# Patient Record
Sex: Female | Born: 1975
Health system: Southern US, Community
[De-identification: ages and names within clinical notes are randomized; demographics above are authoritative.]

## PROBLEM LIST (undated history)

## (undated) DIAGNOSIS — T8859XA Other complications of anesthesia, initial encounter: Secondary | ICD-10-CM

## (undated) DIAGNOSIS — M797 Fibromyalgia: Secondary | ICD-10-CM

## (undated) DIAGNOSIS — J329 Chronic sinusitis, unspecified: Secondary | ICD-10-CM

## (undated) DIAGNOSIS — I10 Essential (primary) hypertension: Secondary | ICD-10-CM

## (undated) DIAGNOSIS — S43439A Superior glenoid labrum lesion of unspecified shoulder, initial encounter: Secondary | ICD-10-CM

## (undated) DIAGNOSIS — E785 Hyperlipidemia, unspecified: Secondary | ICD-10-CM

## (undated) DIAGNOSIS — T4145XA Adverse effect of unspecified anesthetic, initial encounter: Secondary | ICD-10-CM

## (undated) DIAGNOSIS — Z87442 Personal history of urinary calculi: Secondary | ICD-10-CM

## (undated) HISTORY — PX: LAPAROSCOPY: SHX197

## (undated) HISTORY — PX: WISDOM TOOTH EXTRACTION: SHX21

## (undated) HISTORY — DX: Fibromyalgia: M79.7

## (undated) HISTORY — PX: BREAST SURGERY: SHX581

## (undated) HISTORY — DX: Hyperlipidemia, unspecified: E78.5

## (undated) HISTORY — PX: TUMOR REMOVAL: SHX12

## (undated) HISTORY — DX: Essential (primary) hypertension: I10

---

## 1997-08-27 ENCOUNTER — Inpatient Hospital Stay (HOSPITAL_COMMUNITY): Admission: AD | Admit: 1997-08-27 | Discharge: 1997-08-27 | Payer: Self-pay | Admitting: Obstetrics & Gynecology

## 1997-09-01 ENCOUNTER — Inpatient Hospital Stay (HOSPITAL_COMMUNITY): Admission: AD | Admit: 1997-09-01 | Discharge: 1997-09-01 | Payer: Self-pay | Admitting: Obstetrics and Gynecology

## 1997-09-20 ENCOUNTER — Inpatient Hospital Stay (HOSPITAL_COMMUNITY): Admission: AD | Admit: 1997-09-20 | Discharge: 1997-09-22 | Payer: Self-pay | Admitting: Obstetrics and Gynecology

## 1997-10-26 ENCOUNTER — Other Ambulatory Visit: Admission: RE | Admit: 1997-10-26 | Discharge: 1997-10-26 | Payer: Self-pay | Admitting: Obstetrics & Gynecology

## 1999-01-10 ENCOUNTER — Other Ambulatory Visit: Admission: RE | Admit: 1999-01-10 | Discharge: 1999-01-10 | Payer: Self-pay | Admitting: Obstetrics & Gynecology

## 1999-11-13 ENCOUNTER — Other Ambulatory Visit: Admission: RE | Admit: 1999-11-13 | Discharge: 1999-11-13 | Payer: Self-pay | Admitting: Obstetrics & Gynecology

## 1999-12-24 ENCOUNTER — Encounter (INDEPENDENT_AMBULATORY_CARE_PROVIDER_SITE_OTHER): Payer: Self-pay

## 1999-12-24 ENCOUNTER — Other Ambulatory Visit: Admission: RE | Admit: 1999-12-24 | Discharge: 1999-12-24 | Payer: Self-pay | Admitting: Plastic Surgery

## 2002-01-06 ENCOUNTER — Other Ambulatory Visit: Admission: RE | Admit: 2002-01-06 | Discharge: 2002-01-06 | Payer: Self-pay | Admitting: Obstetrics & Gynecology

## 2002-12-29 ENCOUNTER — Other Ambulatory Visit: Admission: RE | Admit: 2002-12-29 | Discharge: 2002-12-29 | Payer: Self-pay | Admitting: Obstetrics & Gynecology

## 2004-01-02 ENCOUNTER — Other Ambulatory Visit: Admission: RE | Admit: 2004-01-02 | Discharge: 2004-01-02 | Payer: Self-pay | Admitting: Obstetrics & Gynecology

## 2004-02-23 ENCOUNTER — Encounter: Admission: RE | Admit: 2004-02-23 | Discharge: 2004-05-23 | Payer: Self-pay | Admitting: Obstetrics and Gynecology

## 2004-03-19 ENCOUNTER — Encounter: Admission: RE | Admit: 2004-03-19 | Discharge: 2004-03-19 | Payer: Self-pay | Admitting: Otolaryngology

## 2005-01-29 ENCOUNTER — Other Ambulatory Visit: Admission: RE | Admit: 2005-01-29 | Discharge: 2005-01-29 | Payer: Self-pay | Admitting: Obstetrics & Gynecology

## 2005-11-22 ENCOUNTER — Emergency Department (HOSPITAL_COMMUNITY): Admission: EM | Admit: 2005-11-22 | Discharge: 2005-11-22 | Payer: Self-pay | Admitting: Emergency Medicine

## 2006-03-31 ENCOUNTER — Ambulatory Visit (HOSPITAL_COMMUNITY): Admission: RE | Admit: 2006-03-31 | Discharge: 2006-03-31 | Payer: Self-pay | Admitting: Obstetrics & Gynecology

## 2007-06-27 ENCOUNTER — Emergency Department (HOSPITAL_COMMUNITY): Admission: EM | Admit: 2007-06-27 | Discharge: 2007-06-28 | Payer: Self-pay | Admitting: Emergency Medicine

## 2007-08-24 ENCOUNTER — Ambulatory Visit: Payer: Self-pay | Admitting: Cardiology

## 2007-09-02 ENCOUNTER — Ambulatory Visit: Payer: Self-pay | Admitting: Cardiology

## 2007-09-02 ENCOUNTER — Encounter: Payer: Self-pay | Admitting: Cardiology

## 2007-09-02 ENCOUNTER — Ambulatory Visit: Payer: Self-pay

## 2007-09-02 LAB — CONVERTED CEMR LAB
CRP, High Sensitivity: 8 — ABNORMAL HIGH (ref 0.00–5.00)
HDL: 39.5 mg/dL (ref >39.0)
Total CHOL/HDL Ratio: 6.6
VLDL: 55 mg/dL — ABNORMAL HIGH (ref 0–40)

## 2007-10-06 ENCOUNTER — Ambulatory Visit: Payer: Self-pay | Admitting: Cardiology

## 2007-12-29 ENCOUNTER — Ambulatory Visit: Payer: Self-pay | Admitting: Cardiology

## 2007-12-29 LAB — CONVERTED CEMR LAB
ALT: 16 units/L (ref 0–35)
Alkaline Phosphatase: 52 units/L (ref 39–117)
Bilirubin, Direct: 0.1 mg/dL (ref 0.0–0.3)
Cholesterol: 116 mg/dL (ref 0–200)
LDL Cholesterol: 52 mg/dL (ref 0–99)
Total Bilirubin: 0.4 mg/dL (ref 0.3–1.2)
Total Protein: 6.1 g/dL (ref 6.0–8.3)

## 2008-01-06 ENCOUNTER — Ambulatory Visit: Payer: Self-pay | Admitting: Cardiology

## 2008-02-29 ENCOUNTER — Encounter: Admission: RE | Admit: 2008-02-29 | Discharge: 2008-02-29 | Payer: Self-pay | Admitting: Family Medicine

## 2010-03-04 ENCOUNTER — Encounter: Payer: Self-pay | Admitting: Obstetrics & Gynecology

## 2010-06-26 NOTE — Assessment & Plan Note (Signed)
[facility]ALTHC[facility]                            CARDIOLOGY OFFICE NOTE   JAMILETTE, SUCHOCKI                MRN:          [id number]  DATE:08/24/2007                            DOB:          [birth year]    I was asked by Dr. Varney Baas to consult on Christine Davenport with  chest discomfort.   HISTORY OF PRESENT ILLNESS:  She is 35 years of age, married, and has 1  child.  She developed Lyme's disease a couple of months ago  characterized by what sounds like a herald patch over the right upper  extremity.  She then developed some chest discomfort that was described  as sharp substernal pain.  It then went into her left shoulder where she  said she felt numb and tingly all the way to her fingers.   She does not have this discomfort frequently.  It is not related to  taking a deep breath or cough.  She denied any recent fever, chills, or  sweats.  She has had no hemoptysis.   When she exerts herself, she actually feels better.  She has not had any  dyspnea on exertion or chest tightness or any symptoms that will be  consistent with potential for ischemia.   She did receive a course of antibiotics for her Lyme's disease.   PAST MEDICAL HISTORY:  She is intolerant to AMOXICILLIN, CODEINE,  CECLOR, SULFUR, CLARITIN, and TOPAMAX.   She does not smoke, does not drink.  She is in to karate 3 times a week.   PAST SURGICAL HISTORY:  Laparoscopes in 2000-2008, bilateral breast  reduction in 2001, ankle surgery for tumors in 1997, and wisdom tooth  extraction in 1995.   CURRENT MEDICATIONS:  1. B12.  2. One a day B2.  3. One a day B3 50,000 international units a week.  4. D3 a 1000 units daily.  5. Magnesium 500 mg a day.  6. Fish oil 1000 mg b.i.d.  7. Loestrin 24 daily.  8. Imipramine 10 mg a day.  9. Hydrochlorothiazide 50 mg a day.   FAMILY HISTORY:  Her father had an MI at age 63.  He smoked remotely  prior to that.  He had no other major  risk factors that I could tell.   SOCIAL HISTORY:  She is an Print production planner.  She has a sedentary job.  She has 1 child.   REVIEW OF SYSTEMS:  She has a history of allergies and hay fever.  She  has chronic fatigue.  She has had some reflux and menstrual dysfunction.  All of her other review of systems were queried and are negative.   PHYSICAL EXAMINATION:  VITAL SIGNS:  Her blood pressure is 141/97, her  heart rate is 86 and regular, her height is 5 feet 1 inch, and she  weighs 166 pounds.  HEENT:  Normocephalic, atraumatic.  PERRL.  Extraocular movements  intact.  Sclerae are clear.  Facial symmetry is normal.  NECK:  Carotids upstrokes equal bilaterally without bruits.  NECK:  Supple.  Thyroid is not enlarged.  There is no obvious  lymphadenopathy.  LUNGS:  Clear to auscultation.  No rub.  HEART:  Poorly appreciated PMI.  Normal S1 and S2 without rub or gallop.  ABDOMEN:  Protuberant, good bowel sounds.  No obvious midline bruit.  No  hepatomegaly.  EXTREMITIES:  No cyanosis, clubbing, or edema.  Pulses are intact.  SKIN:  No rashes or areas of concern.  NEURO:  Intact.   EKG is normal except for a slight rightward axis which is minimal.   She actually visited the emergency room for this chief complaint of  chest pain on Jun 27, 2007.  At that time, her cardiac markers were  negative.  Her EKG was said to be normal, but I do not have a copy of  it, and her chemistry profile and hemoglobin were normal.   ASSESSMENT AND PLAN:  Ms. Steinruck discomfort is atypical, but could  be related to some pericarditis particularly the sharp quality that she  has had.  Tick-borne diseases can cause a rare associated problem with  cardiac involvement.  I see no evidence of this today by EKG.  However,  a 2-D echocardiogram will rule out any significant effusion or other  abnormality.   She is said to have high lipids.  With her family history, she may well  qualify for a statin therapy  based on the JUPITER study.  We tried to  obtain these values from Dr. Donnetta Hail office, but we could not get them.  We will plan on doing fasting lipids and a high-sensitivity CRP.   We will discuss the need for lipids and also followup echo with her by  phone.     Thomas C. Daleen Squibb, MD, [facility]  Electronically Signed    TCW/MedQ  DD: 08/24/2007  DT: 08/25/2007  Job #: [id number]   cc:   Freddy Finner, M.D.

## 2010-06-26 NOTE — Assessment & Plan Note (Signed)
[facility]CARE                            CARDIOLOGY OFFICE NOTE   KESHARA, KIGER                MRN:          [id number]  DATE:01/06/2008                            DOB:          1975-10-28    Ms. Dayley comes in today for followup of her mixed hyperlipidemia.  She has been taking Crestor 10 mg a day.  Her cholesterol has dropped  profoundly from 260 to 116, triglycerides from 274 to 179, LDL from 176  to 52, total cholesterol to HDL ratio was 4.1.  Unfortunately, her HDL  did drop from 39.5 to 28.4.   Her blood pressure has been under good control.  She has been drinking a  lot of caffeinated beverages and is very stressed out about the  holidays.   PHYSICAL EXAMINATION:  VITAL SIGNS:  Her blood pressure today is 130/98,  her pulse is 107 and regular.  Weight is stable 167.  HEENT:  Unchanged.  NECK:  Supple.  Carotid upstrokes were equal bilaterally.  LUNGS:  Clear.  HEART:  A regular rate and rhythm.  ABDOMEN:  Soft.  EXTREMITIES:  There were no edema.  Pulses are intact.  NEUROLOGIC:  Intact.   I am delighted that Ms. Brophy has had such an incredible response to  Crestor just 10 mg a day.  We will cut her to 5 hoping that her HDL will  increase back into the mid 30s to high 30s and her total cholesterol  will stay 150 or less with an LDL well less than 100.   We will check blood work in 3 months.  I will see her back in 6 months.   I have also encouraged her to drink less caffeinated beverages and  increase free water.  She is on hydrochlorothiazide 50 mg a day.     Thomas C. Daleen Squibb, MD, [facility]  Electronically Signed    TCW/MedQ  DD: 01/06/2008  DT: 01/06/2008  Job #: [id number]

## 2010-06-26 NOTE — Assessment & Plan Note (Signed)
[facility]ALTHC[facility]                            CARDIOLOGY OFFICE NOTE   Christine Davenport, Christine Davenport                MRN:          [id number]  DATE:10/06/2007                            DOB:          01-26-1976    Christine Davenport returns today for further management of her family history  of coronary disease, mixed hyperlipidemia, and obesity.   She has really worked hard for months to try to lose weight.  She  calorie counts and exercises on a regular basis.   Her lipid profile showed total cholesterol of 260, triglycerides of 274,  HDL was 39.5, and LDL was 409.8.  Her C-reactive protein was 8, which is  high.   She takes fish oil a gram b.i.d.   Blood pressure today is 121/77, pulse 79 and regular, and weight is 168  and stable.   I had a long talk with Christine Davenport today.  Unless she were to become  a strict vegetarian, lose 35-40 pounds, her numbers are not going to  significantly change.  I would strongly recommend a statin such as  Crestor per the JUPITER study.  I have answered all her questions.  I  have explained the pros and the cons at length.  I have also stated that  the C-reactive protein probably means some significant vasculitis.   PLAN:  1. Begin Crestor 10 mg a day.  2. Check lipids, LFTs, and C-reactive protein in 8 weeks.  3. See me back in 3 months.     Thomas C. Daleen Squibb, MD, [facility]  Electronically Signed    TCW/MedQ  DD: 10/06/2007  DT: 10/07/2007  Job #: [id number]   cc:   Freddy Finner, M.D.

## 2010-11-07 LAB — POCT I-STAT, CHEM 8
BUN: 12
Creatinine, Ser: 0.7
Glucose, Bld: 87
Hemoglobin: 13.6
Potassium: 4.3

## 2010-11-07 LAB — POCT CARDIAC MARKERS
CKMB, poc: <1 — ABNORMAL LOW
Myoglobin, poc: 39.1

## 2010-11-07 LAB — HCG, SERUM, QUALITATIVE: Preg, Serum: NEGATIVE

## 2010-11-27 ENCOUNTER — Emergency Department (HOSPITAL_COMMUNITY)
Admission: EM | Admit: 2010-11-27 | Discharge: 2010-11-28 | Disposition: A | Discharge status: Home / Self Care | Payer: BC Managed Care – PPO | Attending: Emergency Medicine | Admitting: Emergency Medicine

## 2010-11-27 DIAGNOSIS — R109 Unspecified abdominal pain: Secondary | ICD-10-CM | POA: Insufficient documentation

## 2010-11-27 DIAGNOSIS — R1012 Left upper quadrant pain: Secondary | ICD-10-CM | POA: Insufficient documentation

## 2010-11-27 DIAGNOSIS — R1033 Periumbilical pain: Secondary | ICD-10-CM | POA: Insufficient documentation

## 2010-11-27 DIAGNOSIS — I10 Essential (primary) hypertension: Secondary | ICD-10-CM | POA: Insufficient documentation

## 2010-11-27 DIAGNOSIS — K2970 Gastritis, unspecified, without bleeding: Secondary | ICD-10-CM | POA: Insufficient documentation

## 2010-11-27 DIAGNOSIS — R110 Nausea: Secondary | ICD-10-CM | POA: Insufficient documentation

## 2010-11-28 ENCOUNTER — Emergency Department (HOSPITAL_COMMUNITY): Payer: BC Managed Care – PPO

## 2010-11-28 LAB — CBC
Hemoglobin: 14.5 g/dL (ref 12.0–15.0)
MCH: 28.3 pg (ref 26.0–34.0)
Platelets: 244 10*3/uL (ref 150–400)
RBC: 5.12 MIL/uL — ABNORMAL HIGH (ref 3.87–5.11)
WBC: 10.6 10*3/uL — ABNORMAL HIGH (ref 4.0–10.5)

## 2010-11-28 LAB — DIFFERENTIAL
Basophils Absolute: 0.1 10*3/uL (ref 0.0–0.1)
Basophils Relative: 1 % (ref 0–1)
Eosinophils Absolute: 0.1 10*3/uL (ref 0.0–0.7)
Monocytes Relative: 5 % (ref 3–12)
Neutro Abs: 6.3 10*3/uL (ref 1.7–7.7)
Neutrophils Relative %: 60 % (ref 43–77)

## 2010-11-28 LAB — HEPATIC FUNCTION PANEL
Albumin: 4.1 g/dL (ref 3.5–5.2)
Alkaline Phosphatase: 57 U/L (ref 39–117)
Bilirubin, Direct: <0 mg/dL — ABNORMAL LOW (ref 0.0–0.3)
Total Bilirubin: 0.2 mg/dL — ABNORMAL LOW (ref 0.3–1.2)

## 2010-11-28 LAB — URINE MICROSCOPIC-ADD ON

## 2010-11-28 LAB — LIPASE, BLOOD: Lipase: 25 U/L (ref 11–59)

## 2010-11-28 LAB — URINALYSIS, ROUTINE W REFLEX MICROSCOPIC
Bilirubin Urine: NEGATIVE
Leukocytes, UA: NEGATIVE
Nitrite: NEGATIVE
Specific Gravity, Urine: 1.016 (ref 1.005–1.030)
Urobilinogen, UA: 0.2 mg/dL (ref 0.0–1.0)
pH: 6.5 (ref 5.0–8.0)

## 2010-11-28 LAB — POCT PREGNANCY, URINE: Preg Test, Ur: NEGATIVE

## 2010-11-28 MED ORDER — IOHEXOL 300 MG/ML  SOLN
100.0000 mL | Freq: Once | INTRAMUSCULAR | Status: AC | PRN
  Administered 2010-11-28: 100 mL via INTRAVENOUS

## 2011-09-27 ENCOUNTER — Ambulatory Visit (INDEPENDENT_AMBULATORY_CARE_PROVIDER_SITE_OTHER): Payer: BC Managed Care – PPO | Admitting: Emergency Medicine

## 2011-09-27 ENCOUNTER — Telehealth: Payer: Self-pay

## 2011-09-27 VITALS — BP 131/93 | HR 80 | Temp 98.0°F | Resp 16 | Ht 60.5 in | Wt 177.6 lb

## 2011-09-27 DIAGNOSIS — J40 Bronchitis, not specified as acute or chronic: Secondary | ICD-10-CM

## 2011-09-27 DIAGNOSIS — J018 Other acute sinusitis: Secondary | ICD-10-CM

## 2011-09-27 MED ORDER — HYDROCOD POLST-CHLORPHEN POLST 10-8 MG/5ML PO LQCR: 5.0000 mL | Freq: Two times a day (BID) | ORAL | Status: DC | PRN

## 2011-09-27 MED ORDER — LEVOFLOXACIN 500 MG PO TABS: 500.0000 mg | ORAL_TABLET | Freq: Every day | ORAL | Status: AC

## 2011-09-27 MED ORDER — PSEUDOEPHEDRINE-GUAIFENESIN ER 60-600 MG PO TB12: 1.0000 | ORAL_TABLET | Freq: Two times a day (BID) | ORAL | Status: AC

## 2011-09-27 NOTE — Progress Notes (Signed)
   Date:  09/27/2011   Name:  Christine Davenport   DOB:  [birth year]   MRN:  [id number] Gender: female  Age: 36 y.o.  PCP:  Freddy Finner, MD    Chief Complaint: Nausea, Emesis, Fever and Otalgia   History of Present Illness:  Christine Davenport is a 36 y.o. pleasant patient who presents with the following:  Ill since Monday with right ear pain and sinus pressure.  Has clear nasal drainage.  Post nasal drainage unknown color and a non productive cough.  Not responsive to OTC meds.  Fever to 101.5.  Nauseated, some vomiting early.  No stool change.  Myalgias, malaise, fatigue.  Pressure in cheeks and maxillary teeth.  There is no problem list on file for this patient.   No past medical history on file.  No past surgical history on file.  History  Substance Use Topics  . Smoking status: Never Smoker   . Smokeless tobacco: Not on file  . Alcohol Use: Not on file    No family history on file.  Allergies  Allergen Reactions  . Amoxicillin Hives and Other (See Comments)    syncope  . Ceclor (Cefaclor) Other (See Comments) and Hypertension    Increased heart rate  . Claritin (Loratadine) Other (See Comments) and Hypertension    Increased heart rate  . Codeine Nausea And Vomiting  . Topamax (Topiramate) Other (See Comments)    blistering  . Zithromax (Azithromycin) Hives    Medication list has been reviewed and updated.  Current Outpatient Prescriptions on File Prior to Visit  Medication Sig Dispense Refill  . imipramine (TOFRANIL) 10 MG tablet Take 10 mg by mouth at bedtime.        Review of Systems:  As per HPI, otherwise negative.    Physical Examination: Filed Vitals:   09/27/11 1240  BP: 131/93  Pulse: 80  Temp: 98 F (36.7 C)  Resp: 16   Filed Vitals:   09/27/11 1240  Height: 5' 0.5" (1.537 m)  Weight: 177 lb 9.6 oz (80.559 kg)   Body mass index is 34.11 kg/(m^2). Ideal Body Weight: Weight in (lb) to have BMI = 25: 129.9   GEN: WDWN, NAD,  Non-toxic, A & O x 3 HEENT: Atraumatic, Normocephalic. Neck supple. No masses, No LAD.   Ears and Nose: No external deformity.Right otitis media dullness, red, dusky.  Congested nasal mucosa with tenderness maxillary and frontal sinuses CV: RRR, No M/G/R. No JVD. No thrill. No extra heart sounds. PULM: CTA B, no wheezes, crackles, rhonchi. No retractions. No resp. distress. No accessory muscle use. ABD: S, NT, ND, +BS. No rebound. No HSM. EXTR: No c/c/e NEURO Normal gait.  PSYCH: Normally interactive. Conversant. Not depressed or anxious appearing.  Calm demeanor.    Assessment and Plan: Sinusitis and bronchitis augmentin mucinex tussionex Follow up as needed  Carmelina Dane, MD

## 2011-09-27 NOTE — Telephone Encounter (Signed)
error 

## 2012-10-30 ENCOUNTER — Other Ambulatory Visit (HOSPITAL_BASED_OUTPATIENT_CLINIC_OR_DEPARTMENT_OTHER): Payer: Self-pay | Admitting: Family Medicine

## 2012-10-30 ENCOUNTER — Ambulatory Visit (HOSPITAL_BASED_OUTPATIENT_CLINIC_OR_DEPARTMENT_OTHER)
Admission: RE | Admit: 2012-10-30 | Discharge: 2012-10-30 | Disposition: A | Discharge status: Home / Self Care | Payer: BC Managed Care – PPO | Source: Ambulatory Visit | Attending: Family Medicine | Admitting: Family Medicine

## 2012-10-30 DIAGNOSIS — N200 Calculus of kidney: Secondary | ICD-10-CM | POA: Insufficient documentation

## 2012-10-30 DIAGNOSIS — R112 Nausea with vomiting, unspecified: Secondary | ICD-10-CM | POA: Insufficient documentation

## 2012-10-30 DIAGNOSIS — M129 Arthropathy, unspecified: Secondary | ICD-10-CM | POA: Insufficient documentation

## 2012-10-30 DIAGNOSIS — R109 Unspecified abdominal pain: Secondary | ICD-10-CM

## 2012-10-30 MED ORDER — IOHEXOL 300 MG/ML  SOLN
100.0000 mL | Freq: Once | INTRAMUSCULAR | Status: AC | PRN
  Administered 2012-10-30: 100 mL via INTRAVENOUS

## 2012-11-24 ENCOUNTER — Other Ambulatory Visit (HOSPITAL_COMMUNITY): Payer: Self-pay | Admitting: Gastroenterology

## 2012-11-24 DIAGNOSIS — R112 Nausea with vomiting, unspecified: Secondary | ICD-10-CM

## 2012-11-24 DIAGNOSIS — R109 Unspecified abdominal pain: Secondary | ICD-10-CM

## 2012-12-02 ENCOUNTER — Encounter (HOSPITAL_COMMUNITY)
Admission: RE | Admit: 2012-12-02 | Discharge: 2012-12-02 | Disposition: A | Discharge status: Home / Self Care | Payer: BC Managed Care – PPO | Source: Ambulatory Visit | Attending: Gastroenterology | Admitting: Gastroenterology

## 2012-12-02 DIAGNOSIS — R109 Unspecified abdominal pain: Secondary | ICD-10-CM

## 2012-12-02 DIAGNOSIS — R112 Nausea with vomiting, unspecified: Secondary | ICD-10-CM

## 2012-12-02 DIAGNOSIS — R1011 Right upper quadrant pain: Secondary | ICD-10-CM | POA: Insufficient documentation

## 2012-12-02 MED ORDER — TECHNETIUM TC 99M MEBROFENIN IV KIT
5.0000 | PACK | Freq: Once | INTRAVENOUS | Status: AC | PRN
  Administered 2012-12-02: 5 via INTRAVENOUS

## 2012-12-02 MED ORDER — SINCALIDE 5 MCG IJ SOLR: 1.5900 ug/kg | Freq: Once | INTRAMUSCULAR | Status: DC

## 2012-12-02 MED ORDER — STERILE WATER FOR INJECTION IJ SOLN
INTRAMUSCULAR | Status: AC
  Filled 2012-12-02: qty 10

## 2012-12-02 MED ORDER — SINCALIDE 5 MCG IJ SOLR
INTRAMUSCULAR | Status: AC
  Administered 2012-12-02: 1.59 ug via INTRAVENOUS
  Filled 2012-12-02: qty 5

## 2012-12-09 ENCOUNTER — Ambulatory Visit (INDEPENDENT_AMBULATORY_CARE_PROVIDER_SITE_OTHER): Payer: BC Managed Care – PPO | Admitting: General Surgery

## 2012-12-09 ENCOUNTER — Other Ambulatory Visit (INDEPENDENT_AMBULATORY_CARE_PROVIDER_SITE_OTHER): Payer: Self-pay | Admitting: General Surgery

## 2012-12-09 ENCOUNTER — Encounter (INDEPENDENT_AMBULATORY_CARE_PROVIDER_SITE_OTHER): Payer: Self-pay | Admitting: General Surgery

## 2012-12-09 VITALS — BP 136/76 | HR 72 | Temp 98.4°F | Resp 14 | Ht 61.0 in | Wt 174.2 lb

## 2012-12-09 DIAGNOSIS — K811 Chronic cholecystitis: Secondary | ICD-10-CM

## 2012-12-09 DIAGNOSIS — K8050 Calculus of bile duct without cholangitis or cholecystitis without obstruction: Secondary | ICD-10-CM

## 2012-12-09 NOTE — Patient Instructions (Signed)
Symptoms are typical for accelerating gallbladder attacks. We call this biliary colic.  Your CT scan does not show gallstones, but it is not very sensitive for gallstones. In did not show any other major disease process.  you will be scheduled for a gallbladder ultrasound.  We will also go ahead and schedule you for a laparoscopic cholecystectomy with cholangiogram, possible open.      Laparoscopic Cholecystectomy Laparoscopic cholecystectomy is surgery to remove the gallbladder. The gallbladder is located slightly to the right of center in the abdomen, behind the liver. It is a concentrating and storage sac for the bile produced in the liver. Bile aids in the digestion and absorption of fats. Gallbladder disease (cholecystitis) is an inflammation of your gallbladder. This condition is usually caused by a buildup of gallstones (cholelithiasis) in your gallbladder. Gallstones can block the flow of bile, resulting in inflammation and pain. In severe cases, emergency surgery may be required. When emergency surgery is not required, you will have time to prepare for the procedure. Laparoscopic surgery is an alternative to open surgery. Laparoscopic surgery usually has a shorter recovery time. Your common bile duct may also need to be examined and explored. Your caregiver will discuss this with you if he or she feels this should be done. If stones are found in the common bile duct, they may be removed. LET YOUR CAREGIVER KNOW ABOUT:  Allergies to food or medicine.  Medicines taken, including vitamins, herbs, eyedrops, over-the-counter medicines, and creams.  Use of steroids (by mouth or creams).  Previous problems with anesthetics or numbing medicines.  History of bleeding problems or blood clots.  Previous surgery.  Other health problems, including diabetes and kidney problems.  Possibility of pregnancy, if this applies. RISKS AND COMPLICATIONS All surgery is associated with risks. Some  problems that may occur following this procedure include:  Infection.  Damage to the common bile duct, nerves, arteries, veins, or other internal organs such as the stomach or intestines.  Bleeding.  A stone may remain in the common bile duct. BEFORE THE PROCEDURE  Do not take aspirin for 3 days prior to surgery or blood thinners for 1 week prior to surgery.  Do not eat or drink anything after midnight the night before surgery.  Let your caregiver know if you develop a cold or other infectious problem prior to surgery.  You should be present 60 minutes before the procedure or as directed. PROCEDURE  You will be given medicine that makes you sleep (general anesthetic). When you are asleep, your surgeon will make several small cuts (incisions) in your abdomen. One of these incisions is used to insert a small, lighted scope (laparoscope) into the abdomen. The laparoscope helps the surgeon see into your abdomen. Carbon dioxide gas will be pumped into your abdomen. The gas allows more room for the surgeon to perform your surgery. Other operating instruments are inserted through the other incisions. Laparoscopic procedures may not be appropriate when:  There is major scarring from previous surgery.  The gallbladder is extremely inflamed.  There are bleeding disorders or unexpected cirrhosis of the liver.  A pregnancy is near term.  Other conditions make the laparoscopic procedure impossible. If your surgeon feels it is not safe to continue with a laparoscopic procedure, he or she will perform an open abdominal procedure. In this case, the surgeon will make an incision to open the abdomen. This gives the surgeon a larger view and field to work within. This may allow the surgeon to perform  procedures that sometimes cannot be performed with a laparoscope alone. Open surgery has a longer recovery time. AFTER THE PROCEDURE  You will be taken to the recovery area where a nurse will watch and  check your progress.  You may be allowed to go home the same day.  Do not resume physical activities until directed by your caregiver.  You may resume a normal diet and activities as directed. Document Released: 01/28/2005 Document Revised: 04/22/2011 Document Reviewed: 07/13/2010 [facility] Patient Information 2014 [place], [place].

## 2012-12-09 NOTE — Progress Notes (Signed)
Patient ID: Christine Davenport, female   DOB: 08/04/1975, 37 y.o.   MRN: [id number]  Chief Complaint  Patient presents with  . New Evaluation    eval N&V Gallbladder    HPI Christine Davenport is a 37 y.o. female.  She is referred by Dr. Ritta Slot for dilation and management of chronic cholecystitis and biliary dyskinesia. Her primary care physician is Dr. Joycelyn Rua. Her gynecologist is Dr. Konrad Dolores.   She has a 4 to six-week history of upper abdominal pain, sharp, epigastric, radiating to the back, right upper quadrant. Nausea without vomiting. Intermittent diarrhea alternating with solid bowel movements. She clearly has some fatty food intolerance which precipitated some of these attacks. This is big it been more frequent this past week or today.  On 10/30/2012 she had a CT scan of the abdomen which shows some narrowing of the sigmoid colon which was felt to be unchanged from previously. No inflammatory changes. On 12/02/2012 she had a hepatobiliary scan which showed a very low, 7% ejection fraction and she clearly experience pain after CCK injection. Never had an ultrasound.  She is here with her daughter today  Past history is significant for endometriosis. 2 laparoscopies and lysis of adhesions by Dr. Jennette Kettle.   Possible NSAID  gastropathy but never had an upper endoscopy. She was told she had fibromyalgia and is followed at a  pain center as well.   Family  history is negative for colon cancer. Mother had cervical cancer. HPI  Past Medical History  Diagnosis Date  . Hypertension   . Hyperlipidemia   . Fibromyalgia     Past Surgical History  Procedure Laterality Date  . Laparoscopy      2 times  . Breast surgery      reduction  . Tumor removal      nerve tumor on left ankle  . Wisdom tooth extraction      Family History  Problem Relation Age of Onset  . Cancer Mother     cervical  . Heart disease Father   . Cancer Maternal Grandfather     lung  . Cancer Paternal  Grandmother     skin  . Cancer Paternal Grandfather     skin    Social History History  Substance Use Topics  . Smoking status: Never Smoker   . Smokeless tobacco: Never Used  . Alcohol Use: No    Allergies  Allergen Reactions  . Amoxicillin Hives and Other (See Comments)    syncope  . Ceclor [Cefaclor] Other (See Comments) and Hypertension    Increased heart rate  . Claritin [Loratadine] Other (See Comments) and Hypertension    Increased heart rate  . Codeine Nausea And Vomiting  . Sulfur Itching  . Topamax [Topiramate] Other (See Comments)    blistering  . Zithromax [Azithromycin] Hives    Current Outpatient Prescriptions  Medication Sig Dispense Refill  . ALPRAZolam (XANAX) 0.5 MG tablet Take 0.5 mg by mouth at bedtime as needed for sleep.      . baclofen (LIORESAL) 10 MG tablet Take 10 mg by mouth as needed.      . Biotin 5000 MCG TABS Take by mouth.      . CELEBREX 200 MG capsule       . Cholecalciferol (VITAMIN D3) 5000 UNITS CAPS Take by mouth.      Marland Kitchen ibuprofen (ADVIL,MOTRIN) 200 MG tablet Take 200 mg by mouth every 6 (six) hours as needed for pain.      Marland Kitchen  imipramine (TOFRANIL) 10 MG tablet Take 10 mg by mouth at bedtime.      . ranitidine (ZANTAC) 150 MG tablet Take 150 mg by mouth 2 (two) times daily.      . tapentadol (NUCYNTA) 50 MG TABS Take 50 mg by mouth 3 (three) times daily.      . Vitamin D, Ergocalciferol, (DRISDOL) 50000 UNITS CAPS capsule        No current facility-administered medications for this visit.    Review of Systems Review of Systems  Constitutional: Negative for fever, chills and unexpected weight change.  HENT: Negative for congestion, hearing loss, sore throat, trouble swallowing and voice change.   Eyes: Negative for visual disturbance.  Respiratory: Negative for cough and wheezing.   Cardiovascular: Negative for chest pain, palpitations and leg swelling.  Gastrointestinal: Positive for nausea, abdominal pain and diarrhea. Negative  for vomiting, constipation, blood in stool, abdominal distention and anal bleeding.  Genitourinary: Negative for hematuria, vaginal bleeding and difficulty urinating.  Musculoskeletal: Positive for arthralgias, back pain and myalgias.  Skin: Negative for rash and wound.  Neurological: Positive for headaches. Negative for seizures and syncope.  Hematological: Negative for adenopathy. Does not bruise/bleed easily.  Psychiatric/Behavioral: Negative for confusion.    Blood pressure 136/76, pulse 72, temperature 98.4 F (36.9 C), temperature source Temporal, resp. rate 14, height 5\' 1"  (1.549 m), weight 174 lb 3.2 oz (79.017 kg), last menstrual period 11/09/2012.  Physical Exam Physical Exam  Constitutional: She is oriented to person, place, and time. She appears well-developed and well-nourished. No distress.  HENT:  Head: Normocephalic and atraumatic.  Nose: Nose normal.  Mouth/Throat: No oropharyngeal exudate.  Eyes: Conjunctivae and EOM are normal. Pupils are equal, round, and reactive to light. Left eye exhibits no discharge. No scleral icterus.  Neck: Neck supple. No JVD present. No tracheal deviation present. No thyromegaly present.  Cardiovascular: Normal rate, regular rhythm, normal heart sounds and intact distal pulses.   No murmur heard. Pulmonary/Chest: Effort normal and breath sounds normal. No respiratory distress. She has no wheezes. She has no rales. She exhibits no tenderness.  Abdominal: Soft. Bowel sounds are normal. She exhibits no distension and no mass. There is no tenderness. There is no rebound and no guarding.  Transverse scar below  umbilicus. Trocar sites in suprapubic area. Tender in right upper quadrant without guarding or mass.  Musculoskeletal: She exhibits no edema and no tenderness.  Lymphadenopathy:    She has no cervical adenopathy.  Neurological: She is alert and oriented to person, place, and time. She exhibits normal muscle tone. Coordination normal.   Skin: Skin is warm. No rash noted. She is not diaphoretic. No erythema. No pallor.  Psychiatric: She has a normal mood and affect. Her behavior is normal. Judgment and thought content normal.    Data Reviewed CT scan and hepatobiliary scan. Office notes from Dr. Kinnie Scales.  Assessment    Strongly suspect biliary colic, biliary dyskinesia and chronic cholecystitis. Symptom complex, unremarkable CT, and significantly abnormal hepatobiliary scan support this diagnosis. She may or may not have gallstones  History endometriosis  Fibromyalgia, followed by a pain clinic    possible superimposed irritable bowel syndrome, may or may not be contributing to diarrhea.   Question chronic sigmoid colon narrowing. Seen on two previous CT scans.    Plan    Scheduled for laparoscopic cholecystectomy with cholangiogram  Check gallbladder ultrasound preoperatively sure she does not have stones  I discussed the indications, details, techniques, and numerous risk of  the surgery with her. She's aware of the risk of bleeding, infection, conversion to open laparotomy, injury to the colon remained bile duct, wound problems such as hernia, bile leak and other unforeseen problems. She understands all these issues. At this time her questions are answered. She agrees with this plan.  She is aware that she needs a colonoscopy at some point. I agree with Dr. Kinnie Scales that this is not the priority at this point in time        [facility]. Derrell Lolling, M.D., [facility] General and Minimally invasive Surgery Breast and Colorectal Surgery Office:   [phone] Pager:   [phone]  12/09/2012, 5:31 PM

## 2012-12-10 ENCOUNTER — Telehealth (INDEPENDENT_AMBULATORY_CARE_PROVIDER_SITE_OTHER): Payer: Self-pay | Admitting: *Deleted

## 2012-12-10 ENCOUNTER — Other Ambulatory Visit: Payer: BC Managed Care – PPO

## 2012-12-10 NOTE — Telephone Encounter (Signed)
I called and spoke with pt to inform her of her appt for the abdominal US at [facility] on 12/14/12 with an arrival time of 9:15am.  Instructed pt to be NPO after midnight.  Pt agreeable to appt.

## 2012-12-14 ENCOUNTER — Ambulatory Visit
Admission: RE | Admit: 2012-12-14 | Discharge: 2012-12-14 | Disposition: A | Discharge status: Home / Self Care | Payer: BC Managed Care – PPO | Source: Ambulatory Visit | Attending: General Surgery | Admitting: General Surgery

## 2012-12-14 DIAGNOSIS — K8050 Calculus of bile duct without cholangitis or cholecystitis without obstruction: Secondary | ICD-10-CM

## 2012-12-22 ENCOUNTER — Telehealth (INDEPENDENT_AMBULATORY_CARE_PROVIDER_SITE_OTHER): Payer: Self-pay | Admitting: General Surgery

## 2012-12-22 NOTE — Telephone Encounter (Signed)
Pt called in to see if her insurance benefits have changed (deductible ) since 10/29?  Re- ran nothing has changed / will call back when her disability benefits start to pay when she can pay for surgery

## 2013-06-01 ENCOUNTER — Emergency Department (HOSPITAL_COMMUNITY): Payer: BC Managed Care – PPO

## 2013-06-01 ENCOUNTER — Observation Stay (HOSPITAL_COMMUNITY)
Admission: EM | Admit: 2013-06-01 | Discharge: 2013-06-03 | Disposition: A | Discharge status: Home / Self Care | Payer: BC Managed Care – PPO | Attending: Surgery | Admitting: Surgery

## 2013-06-01 ENCOUNTER — Encounter (HOSPITAL_COMMUNITY): Payer: Self-pay | Admitting: Emergency Medicine

## 2013-06-01 DIAGNOSIS — IMO0001 Reserved for inherently not codable concepts without codable children: Secondary | ICD-10-CM | POA: Insufficient documentation

## 2013-06-01 DIAGNOSIS — I10 Essential (primary) hypertension: Secondary | ICD-10-CM | POA: Insufficient documentation

## 2013-06-01 DIAGNOSIS — R109 Unspecified abdominal pain: Secondary | ICD-10-CM | POA: Diagnosis present

## 2013-06-01 DIAGNOSIS — K8000 Calculus of gallbladder with acute cholecystitis without obstruction: Principal | ICD-10-CM | POA: Insufficient documentation

## 2013-06-01 DIAGNOSIS — K8020 Calculus of gallbladder without cholecystitis without obstruction: Secondary | ICD-10-CM

## 2013-06-01 DIAGNOSIS — E785 Hyperlipidemia, unspecified: Secondary | ICD-10-CM | POA: Insufficient documentation

## 2013-06-01 DIAGNOSIS — R1013 Epigastric pain: Secondary | ICD-10-CM

## 2013-06-01 DIAGNOSIS — K8050 Calculus of bile duct without cholangitis or cholecystitis without obstruction: Secondary | ICD-10-CM | POA: Diagnosis present

## 2013-06-01 DIAGNOSIS — E669 Obesity, unspecified: Secondary | ICD-10-CM | POA: Insufficient documentation

## 2013-06-01 LAB — COMPREHENSIVE METABOLIC PANEL
ALT: 14 U/L (ref 0–35)
AST: 19 U/L (ref 0–37)
Albumin: 4.7 g/dL (ref 3.5–5.2)
Alkaline Phosphatase: 66 U/L (ref 39–117)
BUN: 9 mg/dL (ref 6–23)
CO2: 22 meq/L (ref 19–32)
CREATININE: 0.76 mg/dL (ref 0.50–1.10)
Calcium: 9.8 mg/dL (ref 8.4–10.5)
Chloride: 103 mEq/L (ref 96–112)
GFR calc Af Amer: >90 mL/min (ref >90)
GLUCOSE: 99 mg/dL (ref 70–99)
Potassium: 4.4 mEq/L (ref 3.7–5.3)
SODIUM: 142 meq/L (ref 137–147)
Total Bilirubin: 0.4 mg/dL (ref 0.3–1.2)
Total Protein: 8.1 g/dL (ref 6.0–8.3)

## 2013-06-01 LAB — CBC WITH DIFFERENTIAL/PLATELET
Basophils Absolute: 0 10*3/uL (ref 0.0–0.1)
Basophils Relative: 0 % (ref 0–1)
EOS PCT: 0 % (ref 0–5)
Eosinophils Absolute: 0 10*3/uL (ref 0.0–0.7)
HCT: 50 % — ABNORMAL HIGH (ref 36.0–46.0)
Hemoglobin: 17.9 g/dL — ABNORMAL HIGH (ref 12.0–15.0)
LYMPHS PCT: 25 % (ref 12–46)
Lymphs Abs: 4.4 10*3/uL — ABNORMAL HIGH (ref 0.7–4.0)
MCH: 30 pg (ref 26.0–34.0)
MCHC: 35.8 g/dL (ref 30.0–36.0)
MCV: 83.8 fL (ref 78.0–100.0)
MONO ABS: 1.1 10*3/uL — AB (ref 0.1–1.0)
Monocytes Relative: 6 % (ref 3–12)
Neutro Abs: 12.2 10*3/uL — ABNORMAL HIGH (ref 1.7–7.7)
Neutrophils Relative %: 69 % (ref 43–77)
Platelets: 317 10*3/uL (ref 150–400)
RBC: 5.97 MIL/uL — ABNORMAL HIGH (ref 3.87–5.11)
RDW: 12.5 % (ref 11.5–15.5)
WBC: 17.7 10*3/uL — AB (ref 4.0–10.5)

## 2013-06-01 LAB — URINALYSIS, ROUTINE W REFLEX MICROSCOPIC
Bilirubin Urine: NEGATIVE
GLUCOSE, UA: NEGATIVE mg/dL
HGB URINE DIPSTICK: NEGATIVE
KETONES UR: 15 mg/dL — AB
Leukocytes, UA: NEGATIVE
Nitrite: NEGATIVE
Protein, ur: NEGATIVE mg/dL
Specific Gravity, Urine: 1.021 (ref 1.005–1.030)
Urobilinogen, UA: 0.2 mg/dL (ref 0.0–1.0)
pH: 6 (ref 5.0–8.0)

## 2013-06-01 LAB — POC URINE PREG, ED: Preg Test, Ur: NEGATIVE

## 2013-06-01 LAB — LIPASE, BLOOD: Lipase: 28 U/L (ref 11–59)

## 2013-06-01 LAB — I-STAT CG4 LACTIC ACID, ED: LACTIC ACID, VENOUS: 1.38 mmol/L (ref 0.5–2.2)

## 2013-06-01 MED ORDER — METRONIDAZOLE IN NACL 5-0.79 MG/ML-% IV SOLN
500.0000 mg | Freq: Once | INTRAVENOUS | Status: AC
  Administered 2013-06-01: 500 mg via INTRAVENOUS
  Filled 2013-06-01: qty 100

## 2013-06-01 MED ORDER — HYDROMORPHONE HCL PF 1 MG/ML IJ SOLN: 1.0000 mg | INTRAMUSCULAR | Status: DC | PRN

## 2013-06-01 MED ORDER — HYDROMORPHONE HCL PF 1 MG/ML IJ SOLN
1.0000 mg | INTRAMUSCULAR | Status: DC | PRN
  Administered 2013-06-02: 1 mg via INTRAVENOUS
  Filled 2013-06-01: qty 1

## 2013-06-01 MED ORDER — DEXTROSE 5 % IV SOLN
1.0000 g | Freq: Once | INTRAVENOUS | Status: AC
  Administered 2013-06-01: 1 g via INTRAVENOUS
  Filled 2013-06-01: qty 10

## 2013-06-01 MED ORDER — ONDANSETRON HCL 4 MG/2ML IJ SOLN
4.0000 mg | Freq: Once | INTRAMUSCULAR | Status: AC
  Administered 2013-06-01: 4 mg via INTRAVENOUS
  Filled 2013-06-01: qty 2

## 2013-06-01 MED ORDER — HYDROMORPHONE HCL PF 1 MG/ML IJ SOLN
1.0000 mg | Freq: Once | INTRAMUSCULAR | Status: AC
Start: 2013-06-01 — End: 2013-06-01
  Administered 2013-06-01: 1 mg via INTRAVENOUS
  Filled 2013-06-01: qty 1

## 2013-06-01 MED ORDER — PANTOPRAZOLE SODIUM 40 MG IV SOLR
40.0000 mg | Freq: Every day | INTRAVENOUS | Status: DC
Start: 2013-06-01 — End: 2013-06-03
  Administered 2013-06-01 – 2013-06-02 (×2): 40 mg via INTRAVENOUS
  Filled 2013-06-01 (×3): qty 40

## 2013-06-01 MED ORDER — ONDANSETRON HCL 4 MG/2ML IJ SOLN: 4.0000 mg | Freq: Three times a day (TID) | INTRAMUSCULAR | Status: DC | PRN

## 2013-06-01 MED ORDER — ONDANSETRON HCL 4 MG/2ML IJ SOLN
4.0000 mg | Freq: Four times a day (QID) | INTRAMUSCULAR | Status: DC | PRN
  Administered 2013-06-02: 4 mg via INTRAVENOUS
  Filled 2013-06-01: qty 2

## 2013-06-01 MED ORDER — ALPRAZOLAM 0.5 MG PO TABS: 0.5000 mg | ORAL_TABLET | Freq: Every evening | ORAL | Status: DC | PRN

## 2013-06-01 MED ORDER — HYDROMORPHONE HCL PF 1 MG/ML IJ SOLN
1.0000 mg | Freq: Once | INTRAMUSCULAR | Status: AC
  Administered 2013-06-01: 1 mg via INTRAVENOUS
  Filled 2013-06-01: qty 1

## 2013-06-01 MED ORDER — SODIUM CHLORIDE 0.9 % IV BOLUS (SEPSIS)
1000.0000 mL | Freq: Once | INTRAVENOUS | Status: AC
  Administered 2013-06-01: 1000 mL via INTRAVENOUS

## 2013-06-01 MED ORDER — IOHEXOL 300 MG/ML  SOLN
100.0000 mL | Freq: Once | INTRAMUSCULAR | Status: AC | PRN
  Administered 2013-06-01: 100 mL via INTRAVENOUS

## 2013-06-01 MED ORDER — DEXTROSE-NACL 5-0.9 % IV SOLN
INTRAVENOUS | Status: DC
  Administered 2013-06-01 – 2013-06-02 (×2): via INTRAVENOUS

## 2013-06-01 MED ORDER — METRONIDAZOLE IN NACL 5-0.79 MG/ML-% IV SOLN
500.0000 mg | Freq: Three times a day (TID) | INTRAVENOUS | Status: DC
  Administered 2013-06-01 – 2013-06-02 (×2): 500 mg via INTRAVENOUS
  Filled 2013-06-01 (×4): qty 100

## 2013-06-01 MED ORDER — CIPROFLOXACIN IN D5W 400 MG/200ML IV SOLN
400.0000 mg | Freq: Two times a day (BID) | INTRAVENOUS | Status: DC
  Administered 2013-06-01 – 2013-06-03 (×4): 400 mg via INTRAVENOUS
  Filled 2013-06-01 (×5): qty 200

## 2013-06-01 NOTE — ED Provider Notes (Signed)
Medical screening examination/treatment/procedure(s) were performed by non-physician practitioner and as supervising physician I was immediately available for consultation/collaboration.   Celene KrasJon R Jalan Fariss, MD 06/01/13 [phone]

## 2013-06-01 NOTE — ED Notes (Signed)
Pt. Does not want pain meds at this time. She will let me know if that should change.

## 2013-06-01 NOTE — ED Notes (Signed)
CG- result shown to Josh-PA

## 2013-06-01 NOTE — H&P (Signed)
Christine Davenport is an 38 y.o. female.   Chief Complaint: abd pain HPI: 38 y/o F with a h/o biliary dyskenesia.  Pt had been previously worked up and lost her insurance so couldn't follow through with surgery.   Pain started 4d ago.  Increasing pain over last 48hrs.  Epigastric and RUQ pain with some radiation to R back.      Past Medical History  Diagnosis Date  . Hypertension   . Hyperlipidemia   . Fibromyalgia     Past Surgical History  Procedure Laterality Date  . Laparoscopy      2 times  . Breast surgery      reduction  . Tumor removal      nerve tumor on left ankle  . Wisdom tooth extraction      Family History  Problem Relation Age of Onset  . Cancer Mother     cervical  . Heart disease Father   . Cancer Maternal Grandfather     lung  . Cancer Paternal Grandmother     skin  . Cancer Paternal Grandfather     skin   Social History:  reports that she has never smoked. She has never used smokeless tobacco. She reports that she does not drink alcohol or use illicit drugs.  Allergies:  Allergies  Allergen Reactions  . Amoxicillin Hives and Other (See Comments)    syncope  . Ceclor [Cefaclor] Other (See Comments) and Hypertension    Increased heart rate  . Claritin [Loratadine] Other (See Comments) and Hypertension    Increased heart rate  . Codeine Nausea And Vomiting  . Sulfur Itching  . Topamax [Topiramate] Other (See Comments)    blistering  . Zithromax [Azithromycin] Hives     (Not in a hospital admission)  Results for orders placed during the hospital encounter of 06/01/13 (from the past 48 hour(s))  CBC WITH DIFFERENTIAL     Status: Abnormal   Collection Time    06/01/13  2:29 PM      Result Value Ref Range   WBC 17.7 (*) 4.0 - 10.5 K/uL   RBC 5.97 (*) 3.87 - 5.11 MIL/uL   Hemoglobin 17.9 (*) 12.0 - 15.0 g/dL   HCT 50.0 (*) 36.0 - 46.0 %   MCV 83.8  78.0 - 100.0 fL   MCH 30.0  26.0 - 34.0 pg   MCHC 35.8  30.0 - 36.0 g/dL   RDW 12.5   11.5 - 15.5 %   Platelets 317  150 - 400 K/uL   Neutrophils Relative % 69  43 - 77 %   Neutro Abs 12.2 (*) 1.7 - 7.7 K/uL   Lymphocytes Relative 25  12 - 46 %   Lymphs Abs 4.4 (*) 0.7 - 4.0 K/uL   Monocytes Relative 6  3 - 12 %   Monocytes Absolute 1.1 (*) 0.1 - 1.0 K/uL   Eosinophils Relative 0  0 - 5 %   Eosinophils Absolute 0.0  0.0 - 0.7 K/uL   Basophils Relative 0  0 - 1 %   Basophils Absolute 0.0  0.0 - 0.1 K/uL  COMPREHENSIVE METABOLIC PANEL     Status: None   Collection Time    06/01/13  2:29 PM      Result Value Ref Range   Sodium 142  137 - 147 mEq/L   Potassium 4.4  3.7 - 5.3 mEq/L   Chloride 103  96 - 112 mEq/L   CO2 22  19 - 32 mEq/L  Glucose, Bld 99  70 - 99 mg/dL   BUN 9  6 - 23 mg/dL   Creatinine, Ser 0.76  0.50 - 1.10 mg/dL   Calcium 9.8  8.4 - 10.5 mg/dL   Total Protein 8.1  6.0 - 8.3 g/dL   Albumin 4.7  3.5 - 5.2 g/dL   AST 19  0 - 37 U/L   ALT 14  0 - 35 U/L   Alkaline Phosphatase 66  39 - 117 U/L   Total Bilirubin 0.4  0.3 - 1.2 mg/dL   GFR calc non Af Amer >90  >90 mL/min   GFR calc Af Amer >90  >90 mL/min   Comment: (NOTE)     The eGFR has been calculated using the CKD EPI equation.     This calculation has not been validated in all clinical situations.     eGFR's persistently <90 mL/min signify possible Chronic Kidney     Disease.  LIPASE, BLOOD     Status: None   Collection Time    06/01/13  2:29 PM      Result Value Ref Range   Lipase 28  11 - 59 U/L  I-STAT CG4 LACTIC ACID, ED     Status: None   Collection Time    06/01/13  4:51 PM      Result Value Ref Range   Lactic Acid, Venous 1.38  0.5 - 2.2 mmol/L  URINALYSIS, ROUTINE W REFLEX MICROSCOPIC     Status: Abnormal   Collection Time    06/01/13  6:58 PM      Result Value Ref Range   Color, Urine YELLOW  YELLOW   APPearance CLEAR  CLEAR   Specific Gravity, Urine 1.021  1.005 - 1.030   pH 6.0  5.0 - 8.0   Glucose, UA NEGATIVE  NEGATIVE mg/dL   Hgb urine dipstick NEGATIVE  NEGATIVE    Bilirubin Urine NEGATIVE  NEGATIVE   Ketones, ur 15 (*) NEGATIVE mg/dL   Protein, ur NEGATIVE  NEGATIVE mg/dL   Urobilinogen, UA 0.2  0.0 - 1.0 mg/dL   Nitrite NEGATIVE  NEGATIVE   Leukocytes, UA NEGATIVE  NEGATIVE   Comment: MICROSCOPIC NOT DONE ON URINES WITH NEGATIVE PROTEIN, BLOOD, LEUKOCYTES, NITRITE, OR GLUCOSE <1000 mg/dL.  POC URINE PREG, ED     Status: None   Collection Time    06/01/13  7:30 PM      Result Value Ref Range   Preg Test, Ur NEGATIVE  NEGATIVE   Comment:            THE SENSITIVITY OF THIS     METHODOLOGY IS >24 mIU/mL   US Abdomen Complete  06/01/2013   CLINICAL DATA:  Right upper quadrant pain  EXAM: ULTRASOUND ABDOMEN COMPLETE  COMPARISON:  12/14/2012  FINDINGS: Gallbladder:  2.1 cm mobile gallstone. No gallbladder wall thickening or pericholecystic fluid. Positive sonographic Murphy's sign.  Common bile duct:  Diameter: 5 mm  Liver:  Hyperechoic hepatic parenchyma, reflecting hepatic steatosis. No focal hepatic lesion is seen.  IVC:  Not visualized due to overlying bowel gas.  Pancreas:  Poorly visualized due to overlying bowel gas.  Spleen:  Measures 9.9 cm.  Right Kidney:  Length: 11.9 cm.  No mass or hydronephrosis.  Left Kidney:  Length: 11.9 cm.  No mass or hydronephrosis.  Abdominal aorta:  No aneurysm visualized.  Other findings:  None.  IMPRESSION: Cholelithiasis with positive sonographic Murphy's sign, but without associated sonographic findings to suggest acute cholecystitis.  Hepatic steatosis.   Electronically Signed   By: Julian Hy M.D.   On: 06/01/2013 18:35   Ct Abdomen Pelvis W Contrast  06/01/2013   CLINICAL DATA:  Abdominal pain  EXAM: CT ABDOMEN AND PELVIS WITH CONTRAST  TECHNIQUE: Multidetector CT imaging of the abdomen and pelvis was performed using the standard protocol following bolus administration of intravenous contrast.  CONTRAST:  158m OMNIPAQUE IOHEXOL 300 MG/ML  SOLN  COMPARISON:  CT ABD/PELVIS W CM dated 10/30/2012  FINDINGS:  Diffuse hepatic steatosis.  The gallbladder, spleen, pancreas, adrenal, and kidneys are within normal limits.  Normal appendix.  Uterus and bladder are within normal limits. Physiologic cystic changes in the ovaries.  No free-fluid.  No obvious retroperitoneal adenopathy.  L2-3 degenerative disc disease.  IMPRESSION: No acute pathology.   Electronically Signed   By: AMaryclare BeanM.D.   On: 06/01/2013 20:23    Review of Systems  Constitutional: Negative for weight loss.  HENT: Negative for ear discharge, ear pain, hearing loss and tinnitus.   Eyes: Negative for blurred vision, double vision, photophobia and pain.  Respiratory: Negative for cough, sputum production and shortness of breath.   Cardiovascular: Negative for chest pain.  Gastrointestinal: Positive for nausea, vomiting and abdominal pain. Negative for diarrhea.  Genitourinary: Negative for dysuria, urgency, frequency and flank pain.  Musculoskeletal: Negative for back pain, falls, joint pain, myalgias and neck pain.  Neurological: Negative for dizziness, tingling, sensory change, focal weakness, loss of consciousness and headaches.  Endo/Heme/Allergies: Does not bruise/bleed easily.  Psychiatric/Behavioral: Negative for depression, memory loss and substance abuse. The patient is not nervous/anxious.     Blood pressure 132/108, pulse 92, temperature 98.8 F (37.1 C), temperature source Oral, resp. rate 17, height 5' 1" (1.549 m), weight 174 lb (78.926 kg), last menstrual period 05/18/2013, SpO2 97.00%. Physical Exam  Vitals reviewed. Constitutional: She is oriented to person, place, and time. She appears well-developed and well-nourished. She is cooperative. No distress. Cervical collar and nasal cannula in place.  HENT:  Head: Normocephalic and atraumatic. Head is without raccoon's eyes, without Battle's sign, without abrasion, without contusion and without laceration.  Right Ear: Hearing, tympanic membrane, external ear and ear canal  normal. No lacerations. No drainage or tenderness. No foreign bodies. Tympanic membrane is not perforated. No hemotympanum.  Left Ear: Hearing, tympanic membrane, external ear and ear canal normal. No lacerations. No drainage or tenderness. No foreign bodies. Tympanic membrane is not perforated. No hemotympanum.  Nose: Nose normal. No nose lacerations, sinus tenderness, nasal deformity or nasal septal hematoma. No epistaxis.  Mouth/Throat: Uvula is midline, oropharynx is clear and moist and mucous membranes are normal. No lacerations.  Eyes: Conjunctivae, EOM and lids are normal. Pupils are equal, round, and reactive to light. No scleral icterus.  Neck: Trachea normal. No JVD present. No spinous process tenderness and no muscular tenderness present. Carotid bruit is not present. No thyromegaly present.  Cardiovascular: Normal rate, regular rhythm, normal heart sounds, intact distal pulses and normal pulses.   Respiratory: Effort normal and breath sounds normal. No respiratory distress. She exhibits no tenderness, no bony tenderness, no laceration and no crepitus.  GI: Soft. Normal appearance. She exhibits no distension. Bowel sounds are decreased. There is tenderness (RUQ). There is no rigidity, no rebound, no guarding and no CVA tenderness.  Musculoskeletal: Normal range of motion. She exhibits no edema and no tenderness.  Lymphadenopathy:    She has no cervical adenopathy.  Neurological: She is alert and oriented to person,  place, and time. She has normal strength. No cranial nerve deficit or sensory deficit. GCS eye subscore is 4. GCS verbal subscore is 5. GCS motor subscore is 6.  Skin: Skin is warm, dry and intact. She is not diaphoretic.  Psychiatric: She has a normal mood and affect. Her speech is normal and behavior is normal.     Assessment/Plan 38 y/o F with biliary dyskenesia.  US shows no sxs of acute cholecytitis, but with RUQ pain on exam. Will admit and place on IVF.  NPO OR in AM  with Dr. Sandrea Matte 06/01/2013, 9:15 PM

## 2013-06-01 NOTE — ED Notes (Signed)
Transporting patient to new room assignment. 

## 2013-06-01 NOTE — ED Notes (Signed)
Pt. Still does not want medicine for pain.

## 2013-06-01 NOTE — ED Provider Notes (Signed)
CSN: [id number]     Arrival date & time 06/01/13  1408 History   First MD Initiated Contact with Patient 06/01/13 1523     Chief Complaint  Patient presents with  . Abdominal Pain     (Consider location/radiation/quality/duration/timing/severity/associated sxs/prior Treatment) HPI Comments: Patient with history of exploratory laparoscopy due to endometriosis, history of gallstones causing biliary colic and abnormal HIDA scan, unable to have gallbladder removed due to insurance problems -- presents with complaint of "gallbladder attack" for the past 4 days. Patient states that the symptoms became much worse last night. Pain is made worse with movement and palpation. Pain has been associated with vomiting. No diarrhea. No documented fevers. Onset of symptoms acute. Course is gradually worsening. Nothing makes symptoms better  The history is provided by the patient and medical records.    Past Medical History  Diagnosis Date  . Hypertension   . Hyperlipidemia   . Fibromyalgia    Past Surgical History  Procedure Laterality Date  . Laparoscopy      2 times  . Breast surgery      reduction  . Tumor removal      nerve tumor on left ankle  . Wisdom tooth extraction     Family History  Problem Relation Age of Onset  . Cancer Mother     cervical  . Heart disease Father   . Cancer Maternal Grandfather     lung  . Cancer Paternal Grandmother     skin  . Cancer Paternal Grandfather     skin   History  Substance Use Topics  . Smoking status: Never Smoker   . Smokeless tobacco: Never Used  . Alcohol Use: No   OB History   Grav Para Term Preterm Abortions TAB SAB Ect Mult Living                 Review of Systems  Constitutional: Negative for fever.  HENT: Negative for rhinorrhea and sore throat.   Eyes: Negative for redness.  Respiratory: Negative for cough.   Cardiovascular: Negative for chest pain.  Gastrointestinal: Positive for nausea, vomiting and abdominal pain.  Negative for diarrhea and blood in stool.  Genitourinary: Negative for dysuria.  Musculoskeletal: Negative for myalgias.  Skin: Negative for rash.  Neurological: Negative for headaches.   Allergies  Amoxicillin; Ceclor; Claritin; Codeine; Sulfur; Topamax; and Zithromax  Home Medications   Prior to Admission medications   Medication Sig Start Date End Date Taking? Authorizing Provider  ALPRAZolam Prudy Feeler) 0.5 MG tablet Take 0.5 mg by mouth at bedtime as needed for sleep.    Historical Provider, MD  baclofen (LIORESAL) 10 MG tablet Take 10 mg by mouth as needed.    Historical Provider, MD  Biotin 5000 MCG TABS Take by mouth.    Historical Provider, MD  CELEBREX 200 MG capsule  11/25/12   Historical Provider, MD  Cholecalciferol (VITAMIN D3) 5000 UNITS CAPS Take by mouth.    Historical Provider, MD  ibuprofen (ADVIL,MOTRIN) 200 MG tablet Take 200 mg by mouth every 6 (six) hours as needed for pain.    Historical Provider, MD  imipramine (TOFRANIL) 10 MG tablet Take 10 mg by mouth at bedtime.    Historical Provider, MD  ranitidine (ZANTAC) 150 MG tablet Take 150 mg by mouth 2 (two) times daily.    Historical Provider, MD  tapentadol (NUCYNTA) 50 MG TABS Take 50 mg by mouth 3 (three) times daily.    Historical Provider, MD  Vitamin D, Ergocalciferol, (  DRISDOL) 50000 UNITS CAPS capsule  11/26/12   Historical Provider, MD   BP 155/136  Pulse 131  Temp(Src) 99.1 F (37.3 C)  Resp 16  Ht 5\' 1"  (1.549 m)  Wt 174 lb (78.926 kg)  BMI 32.89 kg/m2  SpO2 98%  LMP 05/18/2013  Physical Exam  Nursing note and vitals reviewed. Constitutional: She appears well-developed and well-nourished.  HENT:  Head: Normocephalic and atraumatic.  Eyes: Conjunctivae are normal. Right eye exhibits no discharge. Left eye exhibits no discharge.  Neck: Normal range of motion. Neck supple.  Cardiovascular: Normal rate, regular rhythm and normal heart sounds.   Pulmonary/Chest: Effort normal and breath sounds  normal.  Abdominal: Soft. Bowel sounds are decreased. There is tenderness (Severe right upper quadrant and epigastric tenderness to palpation, milder tenderness over her entire abdomen including lower abdomen) in the right upper quadrant and epigastric area. There is rebound, guarding and positive Murphy's sign. There is no rigidity, no CVA tenderness and no tenderness at McBurney's point.  Neurological: She is alert.  Skin: Skin is warm and dry.  Psychiatric: She has a normal mood and affect.    ED Course  Procedures (including critical care time) Labs Review Labs Reviewed  CBC WITH DIFFERENTIAL - Abnormal; Notable for the following:    WBC 17.7 (*)    RBC 5.97 (*)    Hemoglobin 17.9 (*)    HCT 50.0 (*)    Neutro Abs 12.2 (*)    Lymphs Abs 4.4 (*)    Monocytes Absolute 1.1 (*)    All other components within normal limits  COMPREHENSIVE METABOLIC PANEL  LIPASE, BLOOD  URINALYSIS, ROUTINE W REFLEX MICROSCOPIC  POC URINE PREG, ED  I-STAT CG4 LACTIC ACID, ED    Imaging Review [place] Abdomen Complete  06/01/2013   CLINICAL DATA:  Right upper quadrant pain  EXAM: ULTRASOUND ABDOMEN COMPLETE  COMPARISON:  12/14/2012  FINDINGS: Gallbladder:  2.1 cm mobile gallstone. No gallbladder wall thickening or pericholecystic fluid. Positive sonographic Murphy's sign.  Common bile duct:  Diameter: 5 mm  Liver:  Hyperechoic hepatic parenchyma, reflecting hepatic steatosis. No focal hepatic lesion is seen.  IVC:  Not visualized due to overlying bowel gas.  Pancreas:  Poorly visualized due to overlying bowel gas.  Spleen:  Measures 9.9 cm.  Right Kidney:  Length: 11.9 cm.  No mass or hydronephrosis.  Left Kidney:  Length: 11.9 cm.  No mass or hydronephrosis.  Abdominal aorta:  No aneurysm visualized.  Other findings:  None.  IMPRESSION: Cholelithiasis with positive sonographic Murphy's sign, but without associated sonographic findings to suggest acute cholecystitis.  Hepatic steatosis.   Electronically Signed    By: Charline BillsSriyesh  Krishnan M.D.   On: 06/01/2013 18:35     EKG Interpretation None      3:25 PM Patient seen and examined. Concern for surgical abdomen. No fever or tachycardia. Patient is very uncomfortable. Antibiotics ordered prior to imaging (penicillin allergic, hives/syncope). Work-up initiated. Medications ordered. Consider CT versus abdominal ultrasound. Patient's pain is most likely due to gallbladder and will be able to obtain more prompt results with ultrasound.  Vital signs reviewed and are as follows: Filed Vitals:   06/01/13 1414  BP: 155/136  Pulse: 131  Temp: 99.1 F (37.3 C)  Resp: 16   4:00 PM BP 138/76  Pulse 109  Temp(Src) 98.8 F (37.1 C) (Oral)  Resp 16  Ht 5\' 1"  (1.549 m)  Wt 174 lb (78.926 kg)  BMI 32.89 kg/m2  SpO2 100%  LMP  05/18/2013  7:28 PM US reviewed. Spoke with surgery who has seen and will admit. Dr. Derrell Lollingamirez requests CT to r/o perforation.    MDM   Final diagnoses:  Abdominal pain   Admit for abd pain, concern for cholecystitis.     Renne CriglerJoshua Thania Woodlief, PA-C 06/01/13 1929

## 2013-06-01 NOTE — ED Notes (Signed)
Pt states she was supposed to have gallbladder removed last November, but didn't d/t she lost her job and insurance.  Pt states she's had increasing "attacks" over the last week.  Pt states last night she felt "something tear".  Pt states she thinks her gallbladder ruptured.

## 2013-06-01 NOTE — ED Notes (Signed)
Patient states she is having a panic attack and doing her best to breath through it.

## 2013-06-01 NOTE — ED Notes (Signed)
Patient transported to Ultrasound 

## 2013-06-01 NOTE — ED Notes (Signed)
Pt. Back from CT scan via stretcher.

## 2013-06-02 ENCOUNTER — Encounter (HOSPITAL_COMMUNITY): Admission: EM | Disposition: A | Discharge status: Home / Self Care | Payer: Self-pay | Source: Home / Self Care

## 2013-06-02 ENCOUNTER — Encounter (HOSPITAL_COMMUNITY): Payer: BC Managed Care – PPO | Admitting: Certified Registered Nurse Anesthetist

## 2013-06-02 ENCOUNTER — Observation Stay (HOSPITAL_COMMUNITY): Payer: BC Managed Care – PPO | Admitting: Certified Registered Nurse Anesthetist

## 2013-06-02 DIAGNOSIS — K8010 Calculus of gallbladder with chronic cholecystitis without obstruction: Secondary | ICD-10-CM

## 2013-06-02 HISTORY — PX: CHOLECYSTECTOMY: SHX55

## 2013-06-02 LAB — SURGICAL PCR SCREEN
MRSA, PCR: NEGATIVE
Staphylococcus aureus: NEGATIVE

## 2013-06-02 SURGERY — LAPAROSCOPIC CHOLECYSTECTOMY
Anesthesia: General

## 2013-06-02 MED ORDER — LACTATED RINGERS IV SOLN
INTRAVENOUS | Status: DC
  Administered 2013-06-02: 10:00:00 via INTRAVENOUS

## 2013-06-02 MED ORDER — HYDROMORPHONE HCL PF 1 MG/ML IJ SOLN
0.2500 mg | INTRAMUSCULAR | Status: DC | PRN
  Administered 2013-06-02 (×4): 0.5 mg via INTRAVENOUS

## 2013-06-02 MED ORDER — OXYCODONE HCL 5 MG PO TABS: 5.0000 mg | ORAL_TABLET | Freq: Once | ORAL | Status: DC | PRN

## 2013-06-02 MED ORDER — OXYCODONE HCL 5 MG/5ML PO SOLN: 5.0000 mg | Freq: Once | ORAL | Status: DC | PRN

## 2013-06-02 MED ORDER — ONDANSETRON HCL 4 MG/2ML IJ SOLN
INTRAMUSCULAR | Status: DC | PRN
  Administered 2013-06-02: 4 mg via INTRAVENOUS

## 2013-06-02 MED ORDER — MIDAZOLAM HCL 2 MG/2ML IJ SOLN
INTRAMUSCULAR | Status: AC
  Filled 2013-06-02: qty 2

## 2013-06-02 MED ORDER — HYDROMORPHONE HCL PF 1 MG/ML IJ SOLN
INTRAMUSCULAR | Status: AC
  Filled 2013-06-02: qty 1

## 2013-06-02 MED ORDER — SUCCINYLCHOLINE CHLORIDE 20 MG/ML IJ SOLN
INTRAMUSCULAR | Status: AC
  Filled 2013-06-02: qty 1

## 2013-06-02 MED ORDER — ONDANSETRON HCL 4 MG/2ML IJ SOLN: 4.0000 mg | Freq: Four times a day (QID) | INTRAMUSCULAR | Status: DC | PRN

## 2013-06-02 MED ORDER — HYDROMORPHONE HCL PF 1 MG/ML IJ SOLN
0.5000 mg | INTRAMUSCULAR | Status: AC | PRN
  Administered 2013-06-02 (×2): 0.5 mg via INTRAVENOUS
  Administered 2013-06-02: 14:00:00 via INTRAVENOUS
  Administered 2013-06-02: 0.5 mg via INTRAVENOUS

## 2013-06-02 MED ORDER — PHENYLEPHRINE 40 MCG/ML (10ML) SYRINGE FOR IV PUSH (FOR BLOOD PRESSURE SUPPORT)
PREFILLED_SYRINGE | INTRAVENOUS | Status: AC
  Filled 2013-06-02: qty 10

## 2013-06-02 MED ORDER — SUCCINYLCHOLINE CHLORIDE 20 MG/ML IJ SOLN
INTRAMUSCULAR | Status: DC | PRN
  Administered 2013-06-02: 120 mg via INTRAVENOUS

## 2013-06-02 MED ORDER — ACETAMINOPHEN 325 MG PO TABS
650.0000 mg | ORAL_TABLET | Freq: Once | ORAL | Status: AC
  Administered 2013-06-02: 650 mg via ORAL
  Filled 2013-06-02: qty 2

## 2013-06-02 MED ORDER — ROCURONIUM BROMIDE 50 MG/5ML IV SOLN
INTRAVENOUS | Status: AC
  Filled 2013-06-02: qty 1

## 2013-06-02 MED ORDER — LIDOCAINE HCL (CARDIAC) 20 MG/ML IV SOLN
INTRAVENOUS | Status: AC
  Filled 2013-06-02: qty 5

## 2013-06-02 MED ORDER — PROPOFOL 10 MG/ML IV BOLUS
INTRAVENOUS | Status: AC
  Filled 2013-06-02: qty 20

## 2013-06-02 MED ORDER — PROPOFOL 10 MG/ML IV BOLUS
INTRAVENOUS | Status: DC | PRN
  Administered 2013-06-02: 180 mg via INTRAVENOUS

## 2013-06-02 MED ORDER — KETOROLAC TROMETHAMINE 30 MG/ML IJ SOLN
INTRAMUSCULAR | Status: DC | PRN
  Administered 2013-06-02: 30 mg via INTRAVENOUS

## 2013-06-02 MED ORDER — LIDOCAINE HCL (CARDIAC) 20 MG/ML IV SOLN
INTRAVENOUS | Status: DC | PRN
  Administered 2013-06-02: 70 mg via INTRAVENOUS

## 2013-06-02 MED ORDER — FENTANYL CITRATE 0.05 MG/ML IJ SOLN
INTRAMUSCULAR | Status: DC | PRN
  Administered 2013-06-02: 125 ug via INTRAVENOUS
  Administered 2013-06-02: 25 ug via INTRAVENOUS
  Administered 2013-06-02: 100 ug via INTRAVENOUS

## 2013-06-02 MED ORDER — SODIUM CHLORIDE 0.9 % IR SOLN
Status: DC | PRN
  Administered 2013-06-02: 1000 mL

## 2013-06-02 MED ORDER — LACTATED RINGERS IV SOLN
INTRAVENOUS | Status: DC | PRN
  Administered 2013-06-02: 11:00:00 via INTRAVENOUS

## 2013-06-02 MED ORDER — SODIUM CHLORIDE 0.9 % IJ SOLN
INTRAMUSCULAR | Status: AC
  Filled 2013-06-02: qty 10

## 2013-06-02 MED ORDER — HYDROMORPHONE HCL PF 1 MG/ML IJ SOLN
INTRAMUSCULAR | Status: AC
  Administered 2013-06-02: 0.5 mg via INTRAVENOUS
  Filled 2013-06-02: qty 1

## 2013-06-02 MED ORDER — 0.9 % SODIUM CHLORIDE (POUR BTL) OPTIME
TOPICAL | Status: DC | PRN
  Administered 2013-06-02: 1000 mL

## 2013-06-02 MED ORDER — EPHEDRINE SULFATE 50 MG/ML IJ SOLN
INTRAMUSCULAR | Status: AC
  Filled 2013-06-02: qty 1

## 2013-06-02 MED ORDER — MIDAZOLAM HCL 5 MG/5ML IJ SOLN
INTRAMUSCULAR | Status: DC | PRN
  Administered 2013-06-02: 2 mg via INTRAVENOUS

## 2013-06-02 MED ORDER — HYDROMORPHONE HCL PF 1 MG/ML IJ SOLN: 1.0000 mg | INTRAMUSCULAR | Status: DC | PRN

## 2013-06-02 MED ORDER — FENTANYL CITRATE 0.05 MG/ML IJ SOLN
INTRAMUSCULAR | Status: AC
  Filled 2013-06-02: qty 5

## 2013-06-02 MED ORDER — BUPIVACAINE-EPINEPHRINE (PF) 0.25% -1:200000 IJ SOLN
INTRAMUSCULAR | Status: AC
  Filled 2013-06-02: qty 30

## 2013-06-02 MED ORDER — OXYCODONE-ACETAMINOPHEN 5-325 MG PO TABS
1.0000 | ORAL_TABLET | ORAL | Status: DC | PRN
  Administered 2013-06-02: 1 via ORAL
  Administered 2013-06-03 (×3): 2 via ORAL
  Filled 2013-06-02 (×2): qty 2
  Filled 2013-06-02: qty 1
  Filled 2013-06-02: qty 2

## 2013-06-02 MED ORDER — ONDANSETRON HCL 4 MG/2ML IJ SOLN
INTRAMUSCULAR | Status: AC
  Filled 2013-06-02: qty 2

## 2013-06-02 MED ORDER — BUPIVACAINE-EPINEPHRINE 0.25% -1:200000 IJ SOLN
INTRAMUSCULAR | Status: DC | PRN
  Administered 2013-06-02: 30 mL

## 2013-06-02 SURGICAL SUPPLY — 37 items
APPLIER CLIP 5 13 M/L LIGAMAX5 (MISCELLANEOUS) ×2
BANDAGE ADHESIVE 1X3 (GAUZE/BANDAGES/DRESSINGS) ×8 IMPLANT
BENZOIN TINCTURE PRP APPL 2/3 (GAUZE/BANDAGES/DRESSINGS) ×2 IMPLANT
BLADE 15 SAFETY STRL DISP (BLADE) ×2 IMPLANT
CANISTER SUCTION 2500CC (MISCELLANEOUS) ×2 IMPLANT
CHLORAPREP W/TINT 26ML (MISCELLANEOUS) ×2 IMPLANT
CLIP APPLIE 5 13 M/L LIGAMAX5 (MISCELLANEOUS) ×1 IMPLANT
CLSR STERI-STRIP ANTIMIC 1/2X4 (GAUZE/BANDAGES/DRESSINGS) ×2 IMPLANT
COVER MAYO STAND STRL (DRAPES) IMPLANT
COVER SURGICAL LIGHT HANDLE (MISCELLANEOUS) ×2 IMPLANT
DECANTER SPIKE VIAL GLASS SM (MISCELLANEOUS) ×2 IMPLANT
DRAPE C-ARM 42X72 X-RAY (DRAPES) IMPLANT
DRAPE UTILITY 15X26 W/TAPE STR (DRAPE) ×4 IMPLANT
ELECT REM PT RETURN 9FT ADLT (ELECTROSURGICAL) ×2
ELECTRODE REM PT RTRN 9FT ADLT (ELECTROSURGICAL) ×1 IMPLANT
GLOVE SURG SIGNA 7.5 PF LTX (GLOVE) ×2 IMPLANT
GOWN STRL REUS W/ TWL LRG LVL3 (GOWN DISPOSABLE) ×3 IMPLANT
GOWN STRL REUS W/ TWL XL LVL3 (GOWN DISPOSABLE) ×1 IMPLANT
GOWN STRL REUS W/TWL LRG LVL3 (GOWN DISPOSABLE) ×3
GOWN STRL REUS W/TWL XL LVL3 (GOWN DISPOSABLE) ×1
KIT BASIN OR (CUSTOM PROCEDURE TRAY) ×2 IMPLANT
KIT ROOM TURNOVER OR (KITS) ×2 IMPLANT
NS IRRIG 1000ML POUR BTL (IV SOLUTION) ×2 IMPLANT
PAD ARMBOARD 7.5X6 YLW CONV (MISCELLANEOUS) ×2 IMPLANT
POUCH SPECIMEN RETRIEVAL 10MM (ENDOMECHANICALS) ×2 IMPLANT
SCISSORS LAP 5X35 DISP (ENDOMECHANICALS) ×2 IMPLANT
SET CHOLANGIOGRAPH 5 50 .035 (SET/KITS/TRAYS/PACK) IMPLANT
SET IRRIG TUBING LAPAROSCOPIC (IRRIGATION / IRRIGATOR) ×2 IMPLANT
SLEEVE ENDOPATH XCEL 5M (ENDOMECHANICALS) ×4 IMPLANT
SPECIMEN JAR SMALL (MISCELLANEOUS) ×2 IMPLANT
SUT MON AB 4-0 PC3 18 (SUTURE) ×2 IMPLANT
TOWEL OR 17X24 6PK STRL BLUE (TOWEL DISPOSABLE) ×2 IMPLANT
TOWEL OR 17X26 10 PK STRL BLUE (TOWEL DISPOSABLE) ×2 IMPLANT
TRAY LAPAROSCOPIC (CUSTOM PROCEDURE TRAY) ×2 IMPLANT
TROCAR XCEL BLUNT TIP 100MML (ENDOMECHANICALS) ×2 IMPLANT
TROCAR XCEL NON-BLD 5MMX100MML (ENDOMECHANICALS) ×2 IMPLANT
WATER STERILE IRR 1000ML POUR (IV SOLUTION) IMPLANT

## 2013-06-02 NOTE — Transfer of Care (Signed)
Immediate Anesthesia Transfer of Care Note  Patient: Christine Davenport  Procedure(s) Performed: Procedure(s): LAPAROSCOPIC CHOLECYSTECTOMY (N/A)  Patient Location: PACU  Anesthesia Type:General  Level of Consciousness: awake, alert , oriented and patient cooperative  Airway & Oxygen Therapy: Patient Spontanous Breathing and Patient connected to nasal cannula oxygen  Post-op Assessment: Report given to PACU RN, Post -op Vital signs reviewed and stable and Patient moving all extremities X 4  Post vital signs: Reviewed and stable  Complications: No apparent anesthesia complications

## 2013-06-02 NOTE — Op Note (Signed)
Laparoscopic Cholecystectomy Procedure Note  Indications: This patient presents with symptomatic gallbladder disease and will undergo laparoscopic cholecystectomy.  Pre-operative Diagnosis: Calculus of gallbladder with acute cholecystitis, without mention of obstruction  Post-operative Diagnosis: Same  Surgeon: Shelly Rubensteinouglas A Doak Mah   Assistants: 0  Anesthesia: General endotracheal anesthesia  ASA Class: 1  Procedure Details  The patient was seen again in the Holding Room. The risks, benefits, complications, treatment options, and expected outcomes were discussed with the patient. The possibilities of reaction to medication, pulmonary aspiration, perforation of viscus, bleeding, recurrent infection, finding a normal gallbladder, the need for additional procedures, failure to diagnose a condition, the possible need to convert to an open procedure, and creating a complication requiring transfusion or operation were discussed with the patient. The likelihood of improving the patient's symptoms with return to their baseline status is good.  The patient and/or family concurred with the proposed plan, giving informed consent. The site of surgery properly noted. The patient was taken to Operating Room, identified as Christine ProctorChristine Davenport and the procedure verified as Laparoscopic Cholecystectomy with Intraoperative Cholangiogram. A Time Out was held and the above information confirmed.  Prior to the induction of general anesthesia, antibiotic prophylaxis was administered. General endotracheal anesthesia was then administered and tolerated well. After the induction, the abdomen was prepped with Chloraprep and draped in sterile fashion. The patient was positioned in the supine position.  Local anesthetic agent was injected into the skin near the umbilicus and an incision made. We dissected down to the abdominal fascia with blunt dissection.  The fascia was incised vertically and we entered the peritoneal  cavity bluntly.  A pursestring suture of 0-Vicryl was placed around the fascial opening.  The Hasson cannula was inserted and secured with the stay suture.  Pneumoperitoneum was then created with CO2 and tolerated well without any adverse changes in the patient's vital signs. An 11-mm port was placed in the subxiphoid position.  Two 5-mm ports were placed in the right upper quadrant. All skin incisions were infiltrated with a local anesthetic agent before making the incision and placing the trocars.   We positioned the patient in reverse Trendelenburg, tilted slightly to the patient's left.  The gallbladder was identified, the fundus grasped and retracted cephalad. Adhesions were lysed bluntly and with the electrocautery where indicated, taking care not to injure any adjacent organs or viscus. The infundibulum was grasped and retracted laterally, exposing the peritoneum overlying the triangle of Calot. This was then divided and exposed in a blunt fashion. The cystic duct was clearly identified and bluntly dissected circumferentially. A critical view of the cystic duct and cystic artery was obtained.  The cystic duct was then ligated with clips and divided. The cystic artery was, dissected free, ligated with clips and divided as well.   The gallbladder was dissected from the liver bed in retrograde fashion with the electrocautery. The gallbladder was removed and placed in an Endocatch sac. The liver bed was irrigated and inspected. Hemostasis was achieved with the electrocautery. Copious irrigation was utilized and was repeatedly aspirated until clear.  The gallbladder and Endocatch sac were then removed through the umbilical port site.  The pursestring suture was used to close the umbilical fascia.    We again inspected the right upper quadrant for hemostasis.  Pneumoperitoneum was released as we removed the trocars.  4-0 Monocryl was used to close the skin.   Benzoin, steri-strips, and clean dressings were  applied. The patient was then extubated and brought to the recovery  room in stable condition. Instrument, sponge, and needle counts were correct at closure and at the conclusion of the case.   Findings: Cholecystitis with Cholelithiasis  Estimated Blood Loss: Minimal         Drains: 0         Specimens: Gallbladder           Complications: None; patient tolerated the procedure well.         Disposition: PACU - hemodynamically stable.         Condition: stable

## 2013-06-02 NOTE — Anesthesia Procedure Notes (Signed)
Procedure Name: Intubation Date/Time: 06/02/2013 11:17 AM Performed by: Rogelia BogaMUELLER, [place] P Pre-anesthesia Checklist: Patient identified, Emergency Drugs available, Suction available, Patient being monitored and Timeout performed Patient Re-evaluated:Patient Re-evaluated prior to inductionOxygen Delivery Method: Circle system utilized Intubation Type: IV induction Ventilation: Mask ventilation without difficulty Laryngoscope Size: Mac and 4 Grade View: Grade II Tube type: Oral Tube size: 7.5 mm Number of attempts: 1 Airway Equipment and Method: Stylet Placement Confirmation: ETT inserted through vocal cords under direct vision,  positive ETCO2 and breath sounds checked- equal and bilateral Secured at: 21 cm Tube secured with: Tape Dental Injury: Teeth and Oropharynx as per pre-operative assessment

## 2013-06-02 NOTE — Anesthesia Postprocedure Evaluation (Signed)
Anesthesia Post Note  Patient: Christine Davenport  Procedure(s) Performed: Procedure(s) (LRB): LAPAROSCOPIC CHOLECYSTECTOMY (N/A)  Anesthesia type: General  Patient location: PACU  Post pain: Pain level controlled and Adequate analgesia  Post assessment: Post-op Vital signs reviewed, Patient's Cardiovascular Status Stable, Respiratory Function Stable, Patent Airway and Pain level controlled  Last Vitals:  Filed Vitals:   06/02/13 1245  BP: 145/91  Pulse: 77  Temp:   Resp: 12    Post vital signs: Reviewed and stable  Level of consciousness: awake, alert  and oriented  Complications: No apparent anesthesia complications

## 2013-06-02 NOTE — Progress Notes (Signed)
Patient ID: Christine ProctorChristine Basford, female   DOB: [birth year], 38 y.o.   MRN: [id number]  She definitely has cholelithiasis and I suspect cholecystitis based on exam and elevated WBC  I discussed this with her. I plan a Lap chole today.  I discussed the risks which include but is not limited to bleeding, infection, bile duct injury, bile leak, injury to other structures, the need to convert to an open procedure, cardiopulmonary issues, post op recovery, etc.  She agrees to proceed.

## 2013-06-02 NOTE — Anesthesia Preprocedure Evaluation (Addendum)
Anesthesia Evaluation  Patient identified by MRN, date of birth, ID band Patient awake    Reviewed: Allergy & Precautions, H&P , NPO status , Patient's Chart, lab work & pertinent test results  Airway Mallampati: II TM Distance: >3 FB Neck ROM: full    Dental  (+) Teeth Intact   Pulmonary          Cardiovascular hypertension,     Neuro/Psych  Neuromuscular disease    GI/Hepatic   Endo/Other  obese  Renal/GU      Musculoskeletal  (+) Fibromyalgia -  Abdominal   Peds  Hematology   Anesthesia Other Findings   Reproductive/Obstetrics                          Anesthesia Physical Anesthesia Plan  ASA: II  Anesthesia Plan: General   Post-op Pain Management:    Induction: Intravenous  Airway Management Planned: Oral ETT  Additional Equipment:   Intra-op Plan:   Post-operative Plan: Extubation in OR  Informed Consent: I have reviewed the patients History and Physical, chart, labs and discussed the procedure including the risks, benefits and alternatives for the proposed anesthesia with the patient or authorized representative who has indicated his/her understanding and acceptance.   Dental advisory given  Plan Discussed with: CRNA, Anesthesiologist and Surgeon  Anesthesia Plan Comments:        Anesthesia Quick Evaluation

## 2013-06-03 ENCOUNTER — Encounter (HOSPITAL_COMMUNITY): Payer: Self-pay | Admitting: Surgery

## 2013-06-03 MED ORDER — OXYCODONE-ACETAMINOPHEN 5-325 MG PO TABS: 1.0000 | ORAL_TABLET | ORAL | Status: DC | PRN

## 2013-06-03 NOTE — Discharge Instructions (Signed)

## 2013-06-03 NOTE — Discharge Summary (Signed)
Physician Discharge Summary  Christine ProctorChristine Davenport ZOX:[id number] DOB: 1975-09-26 DOA: 06/01/2013  PCP: Freddy FinnerNEAL,W RONALD, MD  Consultation: none  Admit date: 06/01/2013 Discharge date: 06/03/2013  Recommendations for Outpatient Follow-up:   Follow-up Information   Follow up with [facility] On 06/22/2013. (arrive by 3:15PM for a 3:45 post operative check up)    Contact information:   [address]   [place] [address] [phone]      Discharge Diagnoses:  1. Calculous of gallbladder with acute choleycstitis    Surgical Procedure: laparoscopic cholecystectomy---Dr. Magnus IvanBlackman 06/02/13  Discharge Condition: stable Disposition: home  Diet recommendation: low fat diet  Filed Weights   06/01/13 1414  Weight: 174 lb (78.926 kg)       Hospital Course:  Christine Davenport is a 38 year old female with a history of FMA, chronic narcotic therapy, HTN and HLD who [facility].  She has a known history of biliary dyskinesia.  She was found to have acute cholecystitis and underwent the procedure listed above the following day.  She tolerated it well and was transferred to the floor.  Her vital signs remained stable. On POD#1 the patient was tolerating a diet, passing flatus, VSS, ambulating, voiding and pain was well controlled.  She was therefore felt stable for discharge.  Percocet was given to her.  I tried to contact her pain medicine doctor for recommendations, but she was out of the office. Warning signs that warrant immediate attention were discussed.  Medication risks and benefits were reviewed.  Follow up appointment has been made on her behalf.     Physical Exam: General appearance: alert and oriented.  Resp: clear to auscultation bilaterally  Cardio: S1S1 RRR without murmurs or gallops. No edema. GI: +bs, abdomen is soft, appropriately tender.  Incisions are c/d/i. +BS x4 quadrants. No organomegaly, hernias or masses.      Discharge Instructions       Future Appointments Provider Department Dept Phone   06/22/2013 3:45 PM [facility] [place] [phone]       Medication List         ALPRAZolam 0.5 MG tablet  Commonly known as:  XANAX  Take 0.5 mg by mouth at bedtime as needed for sleep.     baclofen 10 MG tablet  Commonly known as:  LIORESAL  Take 10 mg by mouth 4 (four) times daily as needed for muscle spasms.     Biotin 10 MG Tabs  Take 1 tablet by mouth daily.     celecoxib 200 MG capsule  Commonly known as:  CELEBREX  Take 200 mg by mouth 2 (two) times daily.     ibuprofen 200 MG tablet  Commonly known as:  ADVIL,MOTRIN  Take 200 mg by mouth every 6 (six) hours as needed for pain.     imipramine 10 MG tablet  Commonly known as:  TOFRANIL  Take 10 mg by mouth at bedtime as needed (for sleep).     NIACIN PO  Take 1 tablet by mouth daily.     oxyCODONE-acetaminophen 5-325 MG per tablet  Commonly known as:  PERCOCET/ROXICET  Take 1-2 tablets by mouth every 4 (four) hours as needed for moderate pain.     ranitidine 150 MG tablet  Commonly known as:  ZANTAC  Take 150 mg by mouth 2 (two) times daily.     Tapentadol HCl 75 MG Tabs  Take 1 tablet  by mouth every 4 (four) hours as needed (for pain).     Vitamin D-3 5000 UNITS Tabs  Take 1 tablet by mouth daily.       Follow-up Information   Follow up with [facility] On 06/22/2013. (arrive by 3:15PM for a 3:45 post operative check up)    Contact information:   [address]   [place] [address] [phone]        The results of significant diagnostics from this hospitalization (including imaging, microbiology, ancillary and laboratory) are listed below for reference.    Significant Diagnostic Studies: [place] Abdomen Complete  06/01/2013   CLINICAL DATA:  Right upper quadrant pain  EXAM: ULTRASOUND ABDOMEN COMPLETE  COMPARISON:  12/14/2012  FINDINGS:  Gallbladder:  2.1 cm mobile gallstone. No gallbladder wall thickening or pericholecystic fluid. Positive sonographic Murphy's sign.  Common bile duct:  Diameter: 5 mm  Liver:  Hyperechoic hepatic parenchyma, reflecting hepatic steatosis. No focal hepatic lesion is seen.  IVC:  Not visualized due to overlying bowel gas.  Pancreas:  Poorly visualized due to overlying bowel gas.  Spleen:  Measures 9.9 cm.  Right Kidney:  Length: 11.9 cm.  No mass or hydronephrosis.  Left Kidney:  Length: 11.9 cm.  No mass or hydronephrosis.  Abdominal aorta:  No aneurysm visualized.  Other findings:  None.  IMPRESSION: Cholelithiasis with positive sonographic Murphy's sign, but without associated sonographic findings to suggest acute cholecystitis.  Hepatic steatosis.   Electronically Signed   By: Charline BillsSriyesh  Krishnan M.D.   On: 06/01/2013 18:35   Ct Abdomen Pelvis W Contrast  06/01/2013   CLINICAL DATA:  Abdominal pain  EXAM: CT ABDOMEN AND PELVIS WITH CONTRAST  TECHNIQUE: Multidetector CT imaging of the abdomen and pelvis was performed using the standard protocol following bolus administration of intravenous contrast.  CONTRAST:  100mL OMNIPAQUE IOHEXOL 300 MG/ML  SOLN  COMPARISON:  CT ABD/PELVIS W CM dated 10/30/2012  FINDINGS: Diffuse hepatic steatosis.  The gallbladder, spleen, pancreas, adrenal, and kidneys are within normal limits.  Normal appendix.  Uterus and bladder are within normal limits. Physiologic cystic changes in the ovaries.  No free-fluid.  No obvious retroperitoneal adenopathy.  L2-3 degenerative disc disease.  IMPRESSION: No acute pathology.   Electronically Signed   By: Maryclare BeanArt  Hoss M.D.   On: 06/01/2013 20:23    Microbiology: Recent Results (from the past 240 hour(s))  SURGICAL PCR SCREEN     Status: None   Collection Time    06/01/13 11:28 PM      Result Value Ref Range Status   MRSA, PCR NEGATIVE  NEGATIVE Final   Staphylococcus aureus NEGATIVE  NEGATIVE Final   Comment:            The Xpert SA Assay  (FDA     approved for NASAL specimens     in patients over 40 years of age),     is one component of     a comprehensive surveillance     program.  Test performance has     been validated by [facility] for patients greater     than or equal to 797 year old.     It is not intended     to diagnose infection nor to     guide or monitor treatment.     Labs: Basic Metabolic Panel:  Recent Labs Lab 06/01/13 1429  NA 142  K 4.4  CL 103  CO2 22  GLUCOSE 99  BUN 9  CREATININE 0.76  CALCIUM 9.8   Liver Function Tests:  Recent Labs Lab 06/01/13 1429  AST 19  ALT 14  ALKPHOS 66  BILITOT 0.4  PROT 8.1  ALBUMIN 4.7    Recent Labs Lab 06/01/13 1429  LIPASE 28   No results found for this basename: AMMONIA,  in the last 168 hours CBC:  Recent Labs Lab 06/01/13 1429  WBC 17.7*  NEUTROABS 12.2*  HGB 17.9*  HCT 50.0*  MCV 83.8  PLT 317   Cardiac Enzymes: No results found for this basename: CKTOTAL, CKMB, CKMBINDEX, TROPONINI,  in the last 168 hours BNP: BNP (last 3 results) No results found for this basename: PROBNP,  in the last 8760 hours CBG: No results found for this basename: GLUCAP,  in the last 168 hours  Active Problems:   Abdominal pain   Biliary colic   Time coordinating discharge: <30 mins  Signed:  Emina Riebock, ANP-BC

## 2013-06-22 ENCOUNTER — Encounter (INDEPENDENT_AMBULATORY_CARE_PROVIDER_SITE_OTHER): Payer: Self-pay | Admitting: General Surgery

## 2013-06-22 ENCOUNTER — Ambulatory Visit (INDEPENDENT_AMBULATORY_CARE_PROVIDER_SITE_OTHER): Payer: BC Managed Care – PPO | Admitting: General Surgery

## 2013-06-22 VITALS — BP 126/76 | HR 90 | Temp 97.0°F | Ht 61.0 in | Wt 170.0 lb

## 2013-06-22 DIAGNOSIS — K8012 Calculus of gallbladder with acute and chronic cholecystitis without obstruction: Secondary | ICD-10-CM

## 2013-06-22 DIAGNOSIS — K8000 Calculus of gallbladder with acute cholecystitis without obstruction: Secondary | ICD-10-CM

## 2013-06-22 DIAGNOSIS — K8010 Calculus of gallbladder with chronic cholecystitis without obstruction: Secondary | ICD-10-CM

## 2013-06-22 NOTE — Progress Notes (Signed)
Avika Klump 07-29-1975 [id number] 06/22/2013   Shelbie ProctorChristine Davenport is a 38 y.o. female who had a laparoscopic cholecystectomy with intraoperative cholangiogram by Dr. Magnus IvanBlackman.  The pathology report confirmed chronic cholecystitis and cholelithiasis.  The patient reports that they are feeling well with normal bowel movements and good appetite.  The pre-operative symptoms of abdominal pain, nausea, and vomiting have resolved.    Physical examination - Incisions appear well-healed with no sign of infection or bleeding.   Abdomen - soft, non-tender  Impression:  s/p laparoscopic cholecystectomy  Plan:  She may resume a regular diet and full activity.  She may follow-up on a PRN basis.

## 2013-06-22 NOTE — Patient Instructions (Signed)
Follow up as needed

## 2013-08-09 ENCOUNTER — Other Ambulatory Visit: Payer: Self-pay | Admitting: Pain Medicine

## 2013-08-09 DIAGNOSIS — M5137 Other intervertebral disc degeneration, lumbosacral region: Secondary | ICD-10-CM

## 2013-08-16 ENCOUNTER — Ambulatory Visit
Admission: RE | Admit: 2013-08-16 | Discharge: 2013-08-16 | Disposition: A | Discharge status: Home / Self Care | Payer: BC Managed Care – PPO | Source: Ambulatory Visit | Attending: Pain Medicine | Admitting: Pain Medicine

## 2013-08-16 DIAGNOSIS — M5137 Other intervertebral disc degeneration, lumbosacral region: Secondary | ICD-10-CM

## 2014-04-27 ENCOUNTER — Other Ambulatory Visit: Payer: Self-pay | Admitting: Pain Medicine

## 2014-04-27 DIAGNOSIS — M545 Low back pain: Secondary | ICD-10-CM

## 2016-12-26 ENCOUNTER — Other Ambulatory Visit: Payer: Self-pay

## 2016-12-26 ENCOUNTER — Encounter (HOSPITAL_BASED_OUTPATIENT_CLINIC_OR_DEPARTMENT_OTHER): Payer: Self-pay | Admitting: Emergency Medicine

## 2016-12-26 ENCOUNTER — Emergency Department (HOSPITAL_BASED_OUTPATIENT_CLINIC_OR_DEPARTMENT_OTHER): Payer: 59

## 2016-12-26 ENCOUNTER — Emergency Department (HOSPITAL_BASED_OUTPATIENT_CLINIC_OR_DEPARTMENT_OTHER)
Admission: EM | Admit: 2016-12-26 | Discharge: 2016-12-26 | Disposition: A | Discharge status: Home / Self Care | Payer: 59 | Attending: Emergency Medicine | Admitting: Emergency Medicine

## 2016-12-26 DIAGNOSIS — R109 Unspecified abdominal pain: Secondary | ICD-10-CM | POA: Insufficient documentation

## 2016-12-26 DIAGNOSIS — N200 Calculus of kidney: Secondary | ICD-10-CM | POA: Diagnosis not present

## 2016-12-26 DIAGNOSIS — I10 Essential (primary) hypertension: Secondary | ICD-10-CM | POA: Insufficient documentation

## 2016-12-26 LAB — COMPREHENSIVE METABOLIC PANEL
ALBUMIN: 4.5 g/dL (ref 3.5–5.0)
ALT: 15 U/L (ref 14–54)
AST: 21 U/L (ref 15–41)
Alkaline Phosphatase: 56 U/L (ref 38–126)
Anion gap: 9 (ref 5–15)
BUN: 13 mg/dL (ref 6–20)
CO2: 25 mmol/L (ref 22–32)
Calcium: 9.2 mg/dL (ref 8.9–10.3)
Chloride: 103 mmol/L (ref 101–111)
Creatinine, Ser: 0.68 mg/dL (ref 0.44–1.00)
GFR calc Af Amer: >60 mL/min (ref >60)
GFR calc non Af Amer: >60 mL/min (ref >60)
Glucose, Bld: 86 mg/dL (ref 65–99)
Potassium: 3.7 mmol/L (ref 3.5–5.1)
Sodium: 137 mmol/L (ref 135–145)
Total Bilirubin: 0.4 mg/dL (ref 0.3–1.2)
Total Protein: 7.4 g/dL (ref 6.5–8.1)

## 2016-12-26 LAB — CBC WITH DIFFERENTIAL/PLATELET
Basophils Absolute: 0 10*3/uL (ref 0.0–0.1)
Basophils Relative: 0 %
EOS PCT: 0 %
Eosinophils Absolute: 0 10*3/uL (ref 0.0–0.7)
HEMATOCRIT: 41.4 % (ref 36.0–46.0)
Hemoglobin: 14 g/dL (ref 12.0–15.0)
LYMPHS ABS: 3 10*3/uL (ref 0.7–4.0)
LYMPHS PCT: 31 %
MCH: 27.6 pg (ref 26.0–34.0)
MCHC: 33.8 g/dL (ref 30.0–36.0)
MCV: 81.5 fL (ref 78.0–100.0)
Monocytes Absolute: 0.5 10*3/uL (ref 0.1–1.0)
Monocytes Relative: 6 %
Neutro Abs: 6.2 10*3/uL (ref 1.7–7.7)
Neutrophils Relative %: 64 %
Platelets: 244 10*3/uL (ref 150–400)
RBC: 5.08 MIL/uL (ref 3.87–5.11)
RDW: 13.4 % (ref 11.5–15.5)
WBC: 9.7 10*3/uL (ref 4.0–10.5)

## 2016-12-26 LAB — URINALYSIS, MICROSCOPIC (REFLEX)

## 2016-12-26 LAB — URINALYSIS, ROUTINE W REFLEX MICROSCOPIC
Bilirubin Urine: NEGATIVE
GLUCOSE, UA: NEGATIVE mg/dL
Ketones, ur: NEGATIVE mg/dL
Nitrite: NEGATIVE
PH: 5.5 (ref 5.0–8.0)
Protein, ur: NEGATIVE mg/dL
SPECIFIC GRAVITY, URINE: 1.02 (ref 1.005–1.030)

## 2016-12-26 MED ORDER — FENTANYL CITRATE (PF) 100 MCG/2ML IJ SOLN
50.0000 ug | Freq: Once | INTRAMUSCULAR | Status: DC
  Filled 2016-12-26: qty 2

## 2016-12-26 MED ORDER — OXYCODONE-ACETAMINOPHEN 5-325 MG PO TABS: 2.0000 | ORAL_TABLET | ORAL | 0 refills | Status: DC | PRN

## 2016-12-26 MED ORDER — TAMSULOSIN HCL 0.4 MG PO CAPS: 0.4000 mg | ORAL_CAPSULE | Freq: Every day | ORAL | 0 refills | Status: DC

## 2016-12-26 MED ORDER — IBUPROFEN 800 MG PO TABS: 800.0000 mg | ORAL_TABLET | Freq: Three times a day (TID) | ORAL | 0 refills | Status: DC | PRN

## 2016-12-26 MED FILL — OXYCODONE-ACETAMINOPHEN 5-3: 5-325 | 3 days supply | Qty: 15 | Fill #0

## 2016-12-26 MED FILL — TAMSULOSIN HCL 0.4 MG CAP: 0.4 | 30 days supply | Qty: 30 | Fill #0

## 2016-12-26 MED FILL — IBUPROFEN 800 MG TAB: 800 | 7 days supply | Qty: 21 | Fill #0

## 2016-12-26 NOTE — ED Triage Notes (Addendum)
Patient reports that she is having right sided flank pain. The patient reports that she has had similar pain with kidney stones in the past  - patient was given 60 mg of tordal IM at urgent care

## 2016-12-26 NOTE — ED Provider Notes (Signed)
Emergency Department Provider Note   I have reviewed the triage vital signs and the nursing notes.   HISTORY  Chief Complaint Flank Pain   HPI Christine Davenport is a 40 y.o. female with PMH of HLD, HTN, and prior kidney stones resents to the emergency department for evaluation of right flank pain.  Symptoms began with mild pain last night and have since progressed to more severe, sharp pain today.  She states this feels like prior episodes of kidney stones.  She required assistance with stone passage in the distant past but nothing recent.  She states that her last kidney stone is been several years ago.  She went to her PCP who gave intramuscular Toradol and referred her to the emergency department.  She denies any dysuria, hesitancy, urgency.  No fevers or chills.  She is currently finishing her menstrual cycle.  Otherwise no vaginal discharge.   Past Medical History:  Diagnosis Date  . Fibromyalgia   . Hyperlipidemia   . Hypertension     Patient Active Problem List   Diagnosis Date Noted  . Abdominal pain 06/01/2013  . Biliary colic 06/01/2013  . Chronic cholecystitis 12/09/2012    Past Surgical History:  Procedure Laterality Date  . BREAST SURGERY     reduction  . CHOLECYSTECTOMY N/A 06/02/2013   Procedure: LAPAROSCOPIC CHOLECYSTECTOMY;  Surgeon: Shelly Rubenstein, MD;  Location: [facility] OR;  Service: General;  Laterality: N/A;  . LAPAROSCOPY     2 times  . 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 WISDOM TOOTH EXTRACTION      Current Outpatient Rx  . Order #: [id number] Class: Historical Med  . Order #: [id number] Class: Historical Med  . Order #: [id number] Class: Historical Med  . Order #: [id number] Class: Historical Med  . Order #: [id number] Class: Print  . Order #: [id number] Class: Historical Med  . Order #: [id number] Class: Historical Med  . Order #: [id number] Class: Print  . Order #: [id number] Class: Print  . Order #: [id number] Class: Historical Med     Allergies Amoxicillin; Ceclor [cefaclor]; Claritin [loratadine]; Codeine; Sulfur; Topamax [topiramate]; and Zithromax [azithromycin]  Family History  Problem Relation Age of Onset  . Cancer Mother        cervical  . Heart disease Father   . Cancer Maternal Grandfather        lung  . Cancer Paternal Grandmother        skin  . Cancer Paternal Grandfather        skin    Social History Social History   Tobacco Use  . Smoking status: Never Smoker  . Smokeless tobacco: Never Used  Substance Use Topics  . Alcohol use: No  . Drug use: No    Review of Systems  Constitutional: No fever/chills Eyes: No visual changes. ENT: No sore throat. Cardiovascular: Denies chest pain. Respiratory: Denies shortness of breath. Gastrointestinal: Positive RLQ abdominal pain.  No nausea, no vomiting.  No diarrhea.  No constipation. Genitourinary: Negative for dysuria. Positive right flank pain.  Musculoskeletal: Negative for back pain. Skin: Negative for rash. Neurological: Negative for headaches, focal weakness or numbness.  10-point ROS otherwise negative.  ____________________________________________   PHYSICAL EXAM:  VITAL SIGNS: ED Triage Vitals  Enc Vitals Group     BP 12/26/16 1334 (!) 139/105     Pulse Rate 12/26/16 1334 65     Resp 12/26/16 1334 20     Temp 12/26/16 1334 98.1 F (36.7 C)     Temp  Source 12/26/16 1334 Oral     SpO2 12/26/16 1334 100 %     Weight 12/26/16 1333 176 lb (79.8 kg)     Height 12/26/16 1333 5\' 1"  (1.549 m)     Pain Score 12/26/16 1332 8   Constitutional: Alert and oriented. Well appearing and in no acute distress. Eyes: Conjunctivae are normal. Head: Atraumatic. Nose: No congestion/rhinnorhea. Mouth/Throat: Mucous membranes are moist.  Neck: No stridor.   Cardiovascular: Normal rate, regular rhythm. Good peripheral circulation. Grossly normal heart sounds.   Respiratory: Normal respiratory effort.  No retractions. Lungs  CTAB. Gastrointestinal: Soft with mild RLQ tenderness to palpation. Mild/moderate right CVA tenderness to percussion. No rebound or guarding. No distention.  Musculoskeletal: No lower extremity tenderness nor edema. No gross deformities of extremities. Neurologic:  Normal speech and language. No gross focal neurologic deficits are appreciated.  Skin:  Skin is warm, dry and intact. No rash noted.  ____________________________________________   LABS (all labs ordered are listed, but only abnormal results are displayed)  Labs Reviewed  URINALYSIS, ROUTINE W REFLEX MICROSCOPIC - Abnormal; Notable for the following components:      Result Value   Hgb urine dipstick MODERATE (*)    Leukocytes, UA MODERATE (*)    All other components within normal limits  URINALYSIS, MICROSCOPIC (REFLEX) - Abnormal; Notable for the following components:   Bacteria, UA FEW (*)    Squamous Epithelial / LPF 6-30 (*)    All other components within normal limits  URINE CULTURE  COMPREHENSIVE METABOLIC PANEL  CBC WITH DIFFERENTIAL/PLATELET   ____________________________________________  RADIOLOGY  Ct Renal Stone Study  Result Date: 12/26/2016 CLINICAL DATA:  Right flank pain since yesterday.  Nausea. EXAM: CT ABDOMEN AND PELVIS WITHOUT CONTRAST TECHNIQUE: Multidetector CT imaging of the abdomen and pelvis was performed following the standard protocol without IV contrast. COMPARISON:  06/01/2013 FINDINGS: Lower chest:  No contributory findings. Hepatobiliary: No focal liver abnormality.Cholecystectomy with normal common bile duct diameter. Pancreas: Unremarkable. Spleen: Unremarkable. Adrenals/Urinary Tract: Negative adrenals. 1-2 mm stone in the distal right ureter, within 3 cm of the bladder. 2 punctate left renal calculi. Unremarkable bladder. Stomach/Bowel:  No obstruction. No appendicitis. Vascular/Lymphatic: No acute vascular abnormality. No mass or adenopathy. Reproductive:Sub septate uterine morphology.  Other: No ascites or pneumoperitoneum. Musculoskeletal: No acute abnormalities.  L2/3 disc narrowing. IMPRESSION: 1. Punctate (1-2 mm) stone in the distal right ureter. No hydronephrosis. 2. Two punctate left renal calculi on coronal reformats. Electronically Signed   By: Marnee SpringJonathon  Watts M.D.   On: 12/26/2016 14:28    ____________________________________________   PROCEDURES  Procedure(s) performed:   Procedures  None ____________________________________________   INITIAL IMPRESSION / ASSESSMENT AND PLAN / ED COURSE  Pertinent labs & imaging results that were available during my care of the patient were reviewed by me and considered in my medical decision making (see chart for details).  Patient presents to the ED for evaluation of right flank pain and history of complicated kidney stone. No fever/chills. Some tenderness on exam complicating diagnosis slightly. Plan for CT renal protocol along with labs and additional pain medication as the patient appears uncomfortable.   Differential diagnosis includes but is not exclusive to ectopic pregnancy, ovarian cyst, ovarian torsion, acute appendicitis, urinary tract infection, endometriosis, bowel obstruction, hernia, colitis, renal colic, gastroenteritis, volvulus etc.  CT shows 1-2 mm stone in the right ureter without hydro. WIll continue to treat pain, add Flomax, and Urology follow up plan.   At this time, I do not feel  there is any life-threatening condition present. I have reviewed and discussed all results (EKG, imaging, lab, urine as appropriate), exam findings with patient. I have reviewed nursing notes and appropriate previous records.  I feel the patient is safe to be discharged home without further emergent workup. Discussed usual and customary return precautions. Patient and family (if present) verbalize understanding and are comfortable with this plan.  Patient will follow-up with their primary care provider. If they do not have a  primary care provider, information for follow-up has been provided to them. All questions have been answered.   ____________________________________________  FINAL CLINICAL IMPRESSION(S) / ED DIAGNOSES  Final diagnoses:  Right flank pain  Nephrolithiasis     MEDICATIONS GIVEN DURING THIS VISIT:  None  NEW OUTPATIENT MEDICATIONS STARTED DURING THIS VISIT:  Flomax, Percocet, and Motrin   Note:  This document was prepared using Dragon voice recognition software and may include unintentional dictation errors.  Alona BeneJoshua Dailin Sosnowski, MD Emergency Medicine    Accalia Rigdon, Arlyss RepressJoshua G, MD 12/26/16 [phone]

## 2016-12-26 NOTE — ED Notes (Signed)
Pt. Was given Troradol 60mg  IM in R buttock [facility]

## 2016-12-26 NOTE — Discharge Instructions (Signed)
You have been seen in the Emergency Department (ED) today for pain that we believe based on your workup, is caused by kidney stones.  As we have discussed, please drink plenty of fluids.  Please make a follow up appointment with the physician(s) listed elsewhere in this documentation. ° °You may take pain medication as needed but ONLY as prescribed.  Please also take your prescribed Flomax daily.  We also recommend that you take over-the-counter ibuprofen regularly according to label instructions over the next 5 days.  Take it with meals to minimize stomach discomfort. ° °Please see your doctor as soon as possible as stones may take 1-3 weeks to pass and you may require additional care or medications. ° °Do not drink alcohol, drive or participate in any other potentially dangerous activities while taking opiate pain medication as it may make you sleepy. Do not take this medication with any other sedating medications, either prescription or over-the-counter. If you were prescribed Percocet or Vicodin, do not take these with acetaminophen (Tylenol) as it is already contained within these medications. °  °Take Percocet as needed for severe pain.  This medication is an opiate (or narcotic) pain medication and can be habit forming.  Use it as little as possible to achieve adequate pain control.  Do not use or use it with extreme caution if you have a history of opiate abuse or dependence.  If you are on a pain contract with your primary care doctor or a pain specialist, be sure to let them know you were prescribed this medication today from the Emergency Department.  This medication is intended for your use only - do not give any to anyone else and keep it in a secure place where nobody else, especially children, have access to it.  It will also cause or worsen constipation, so you may want to consider taking an over-the-counter stool softener while you are taking this medication. ° °Return to the Emergency Department  (ED) or call your doctor if you have any worsening pain, fever, painful urination, are unable to urinate, or develop other symptoms that concern you. ° ° ° °Kidney Stones °Kidney stones (urolithiasis) are deposits that form inside your kidneys. The intense pain is caused by the stone moving through the urinary tract. When the stone moves, the ureter goes into spasm around the stone. The stone is usually passed in the urine.  °CAUSES  °A disorder that makes certain neck glands produce too much parathyroid hormone (primary hyperparathyroidism). °A buildup of uric acid crystals, similar to gout in your joints. °Narrowing (stricture) of the ureter. °A kidney obstruction present at birth (congenital obstruction). °Previous surgery on the kidney or ureters. °Numerous kidney infections. °SYMPTOMS  °Feeling sick to your stomach (nauseous). °Throwing up (vomiting). °Blood in the urine (hematuria). °Pain that usually spreads (radiates) to the groin. °Frequency or urgency of urination. °DIAGNOSIS  °Taking a history and physical exam. °Blood or urine tests. °CT scan. °Occasionally, an examination of the inside of the urinary bladder (cystoscopy) is performed. °TREATMENT  °Observation. °Increasing your fluid intake. °Extracorporeal shock wave lithotripsy--This is a noninvasive procedure that uses shock waves to break up kidney stones. °Surgery may be needed if you have severe pain or persistent obstruction. There are various surgical procedures. Most of the procedures are performed with the use of small instruments. Only small incisions are needed to accommodate these instruments, so recovery time is minimized. °The size, location, and chemical composition are all important variables that will determine the proper   choice of action for you. Talk to your health care provider to better understand your situation so that you will minimize the risk of injury to yourself and your kidney.  °HOME CARE INSTRUCTIONS  °Drink enough water  and fluids to keep your urine clear or pale yellow. This will help you to pass the stone or stone fragments. °Strain all urine through the provided strainer. Keep all particulate matter and stones for your health care provider to see. The stone causing the pain may be as small as a grain of salt. It is very important to use the strainer each and every time you pass your urine. The collection of your stone will allow your health care provider to analyze it and verify that a stone has actually passed. The stone analysis will often identify what you can do to reduce the incidence of recurrences. °Only take over-the-counter or prescription medicines for pain, discomfort, or fever as directed by your health care provider. °Keep all follow-up visits as told by your health care provider. This is important. °Get follow-up X-rays if required. The absence of pain does not always mean that the stone has passed. It may have only stopped moving. If the urine remains completely obstructed, it can cause loss of kidney function or even complete destruction of the kidney. It is your responsibility to make sure X-rays and follow-ups are completed. Ultrasounds of the kidney can show blockages and the status of the kidney. Ultrasounds are not associated with any radiation and can be performed easily in a matter of minutes. °Make changes to your daily diet as told by your health care provider. You may be told to: °Limit the amount of salt that you eat. °Eat 5 or more servings of fruits and vegetables each day. °Limit the amount of meat, poultry, fish, and eggs that you eat. °Collect a 24-hour urine sample as told by your health care provider. You may need to collect another urine sample every 6-12 months. °SEEK MEDICAL CARE IF: °You experience pain that is progressive and unresponsive to any pain medicine you have been prescribed. °SEEK IMMEDIATE MEDICAL CARE IF:  °Pain cannot be controlled with the prescribed medicine. °You have a  fever or shaking chills. °The severity or intensity of pain increases over 18 hours and is not relieved by pain medicine. °You develop a new onset of abdominal pain. °You feel faint or pass out. °You are unable to urinate. °  °This information is not intended to replace advice given to you by your health care provider. Make sure you discuss any questions you have with your health care provider. °  °Document Released: 01/28/2005 Document Revised: 10/19/2014 Document Reviewed: 07/01/2012 °Elsevier Interactive Patient Education ©2016 [employer] ° ° °

## 2016-12-27 LAB — URINE CULTURE
Culture: <10000 — AB
Special Requests: NORMAL

## 2017-06-18 DIAGNOSIS — M25561 Pain in right knee: Secondary | ICD-10-CM | POA: Diagnosis not present

## 2017-06-26 DIAGNOSIS — M25561 Pain in right knee: Secondary | ICD-10-CM | POA: Diagnosis not present

## 2017-07-03 DIAGNOSIS — M1711 Unilateral primary osteoarthritis, right knee: Secondary | ICD-10-CM | POA: Diagnosis not present

## 2017-07-03 DIAGNOSIS — Q796 Ehlers-Danlos syndrome: Secondary | ICD-10-CM | POA: Diagnosis not present

## 2017-07-03 DIAGNOSIS — M25561 Pain in right knee: Secondary | ICD-10-CM | POA: Diagnosis not present

## 2017-07-15 DIAGNOSIS — M25562 Pain in left knee: Secondary | ICD-10-CM | POA: Diagnosis not present

## 2017-07-15 DIAGNOSIS — S83191D Other subluxation of right knee, subsequent encounter: Secondary | ICD-10-CM | POA: Diagnosis not present

## 2017-07-15 DIAGNOSIS — M1711 Unilateral primary osteoarthritis, right knee: Secondary | ICD-10-CM | POA: Diagnosis not present

## 2017-07-15 DIAGNOSIS — M25561 Pain in right knee: Secondary | ICD-10-CM | POA: Diagnosis not present

## 2017-07-25 DIAGNOSIS — M1712 Unilateral primary osteoarthritis, left knee: Secondary | ICD-10-CM | POA: Diagnosis not present

## 2017-07-25 DIAGNOSIS — M1711 Unilateral primary osteoarthritis, right knee: Secondary | ICD-10-CM | POA: Diagnosis not present

## 2017-08-04 DIAGNOSIS — M1711 Unilateral primary osteoarthritis, right knee: Secondary | ICD-10-CM | POA: Diagnosis not present

## 2017-08-12 DIAGNOSIS — M179 Osteoarthritis of knee, unspecified: Secondary | ICD-10-CM | POA: Diagnosis not present

## 2017-08-12 DIAGNOSIS — Q796 Ehlers-Danlos syndrome: Secondary | ICD-10-CM | POA: Diagnosis not present

## 2017-09-02 DIAGNOSIS — M25561 Pain in right knee: Secondary | ICD-10-CM | POA: Diagnosis not present

## 2017-09-02 DIAGNOSIS — Z01810 Encounter for preprocedural cardiovascular examination: Secondary | ICD-10-CM | POA: Diagnosis not present

## 2017-09-08 ENCOUNTER — Ambulatory Visit: Payer: Self-pay | Admitting: Orthopedic Surgery

## 2017-09-08 NOTE — H&P (View-Only) (Signed)
Shelbie ProctorChristine Davenport is an 42 y.o. female.   Chief Complaint: left knee pain HPI: History: Christine Davenport is here today for her third left knee hyaluronic acid injection.  Previous side effects from hyaluronic acid injectins: side effects present from injections; Patient reports severe fluid retention Reports her whole body swelled up with the last injection of Euflexxa. Mutually agreed to hold off on this 1. She is unable take steroids.  Past Medical History:  Diagnosis Date  . Fibromyalgia   . Hyperlipidemia   . Hypertension   Hyperlaxity/Ehlers Danlos Syndrome GERD Headaches  Past Surgical History:  Procedure Laterality Date  . BREAST SURGERY     reduction  . CHOLECYSTECTOMY N/A 06/02/2013   Procedure: LAPAROSCOPIC CHOLECYSTECTOMY;  Surgeon: Shelly Rubensteinouglas A Blackman, MD;  Location: [facility] OR;  Service: General;  Laterality: N/A;  . LAPAROSCOPY     2 times  . 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 WISDOM TOOTH EXTRACTION      Family History  Problem Relation Age of Onset  . Cancer Mother        cervical  . Heart disease Father   . Cancer Maternal Grandfather        lung  . Cancer Paternal Grandmother        skin  . Cancer Paternal Grandfather        skin   Social History:  reports that she has never smoked. She has never used smokeless tobacco. She reports that she does not drink alcohol or use drugs.  Allergies:  Allergies  Allergen Reactions  . Amoxicillin Hives and Other (See Comments)    syncope  . Ceclor [Cefaclor] Other (See Comments) and Hypertension    Increased heart rate  . Claritin [Loratadine] Other (See Comments) and Hypertension    Increased heart rate  . Codeine Nausea And Vomiting  . Sulfur Itching  . Topamax [Topiramate] Other (See Comments)    blistering  . Zithromax [Azithromycin] Hives  Gabapentin Tramadol  Medications Robaxin Norco  Review of Systems  Constitutional: Negative.   HENT: Negative.   Eyes: Negative.   Respiratory:  Negative.   Cardiovascular: Negative.   Gastrointestinal: Positive for nausea.  Genitourinary: Negative.   Musculoskeletal: Positive for joint pain.  Skin: Negative.   Neurological: Positive for sensory change.    There were no vitals taken for this visit. Physical Exam  Constitutional: She is oriented to person, place, and time. She appears well-developed and well-nourished.  HENT:  Head: Normocephalic.  Eyes: Pupils are equal, round, and reactive to light.  Neck: Normal range of motion.  Cardiovascular: Normal rate.  Respiratory: Effort normal.  GI: Soft.  Musculoskeletal:  Patient is a 42 year old female.  Constitutional: General Appearance: healthy-appearing and NAD.  Gait and Station: Appearance: antalgic gait.  Cardiovascular System: Arterial Pulses Left: femoral normal, popliteal normal, dorsalis pedis normal, and posterior tibialis normal. Edema Left: no edema. Varicosities Left: no varicosities.  Lymph Nodes: Inspection/Palpation Left: no inguinal LAD.  Knees: Inspection Left: no deformity. Bony Palpation Left: no tenderness of the superior pole patella, the inferior pole patella, the tibial tubercle, the medial joint line, the lateral joint line, the medial tibial plateau, Gerdy's tubercle, or the neck of fibula. Soft Tissue Palpation Left: no tenderness of the quadriceps tendon, the prepatellar bursa, the patellar tendon, the medial collateral ligament, or the infrapatellar tendon. Active Range of Motion Left: normal. Stability Left: no laxity or ligamentous instability and anterior drawer sign negative and Lachman test negative.  Special Tests Left: McMurray's test negative. Strength Left: no hamstring weakness or quadriceps weakness and flexion 5/5 and extension 5/5.  Skin: Left Lower Extremity: normal.  Neurologic: Ankle Reflex Left: normal (2). Knee Reflex Left: normal (2). Sensation on the Left: L2 normal, L3 normal, L4 normal, L5 normal, and S1  normal.  Psychiatric: Mood and Affect: active and alert and normal mood.  Moderate effusion on the right.  Neurological: She is alert and oriented to person, place, and time.     Assessment/Plan Patient has a mechanical symptoms and persistent swelling and effusion of the knee with locking and giving way. She has significant patellofemoral arthrosis. And medial joint space narrowing on her x-rays.  We discussed living with her symptoms versus possible knee arthroscopy debridement chondroplasty. And evacuation of loose bodies. He also may have some lateral tilting of the patella and a lateral release may be beneficial as well.  She would like to proceed with that.  I discussed the risk and benefits of knee arthroscopy including no changes in their symptoms worsening in their symptoms DVT, PE, anesthetic complications etc. Surgical possibilities include chondroplasty, microfracture, partial meniscectomy, plica excision etc. We also discussed the possible need for repeat arthroscopy in the future as well as possible continued treatment including corticosteroid injections and possible Visco supplementation. In addition we discussed the possibility of even eventually required a total knee replacement if significant arthritis encountered. Also indicating that it is an outpatient procedure. 1-2 days on crutches. Postoperative DVT prophylaxis with aspirin if tolerated. Follow-up in the office in 2 weeks following the surgery. Possible consideration of formal of supervised physical therapy as well.  She does have underlying comorbidities including fibromyalgia as well as hyperlaxity syndrome.  In addition multiple allergies.  She has had a remote history of a problem with anesthesia 25 years ago but she has had multiple surgeries since I without difficulty.  Allergy to amoxicillin. No history of MRSA exposure. We can utilize clindamycin.  Discussed residual symptoms related to her patellofemoral  arthrosis and may require a partial knee replacement or a full her knee replacement.  Plan right knee arthroscopy, chrondroplasty, possible lateral release  Christine Davenport, Christine Davenport., PA-C for Dr. Shelle Davenport 09/08/2017, 12:29 PM

## 2017-09-08 NOTE — H&P (Signed)
Christine Davenport is an 42 y.o. female.   Chief Complaint: left knee pain HPI: History: Christine Davenport is here today for her third left knee hyaluronic acid injection.  Previous side effects from hyaluronic acid injectins: side effects present from injections; Patient reports severe fluid retention Reports her whole body swelled up with the last injection of Euflexxa. Mutually agreed to hold off on this 1. She is unable take steroids.  Past Medical History:  Diagnosis Date  . Fibromyalgia   . Hyperlipidemia   . Hypertension   Hyperlaxity/Ehlers Danlos Syndrome GERD Headaches  Past Surgical History:  Procedure Laterality Date  . BREAST SURGERY     reduction  . CHOLECYSTECTOMY N/A 06/02/2013   Procedure: LAPAROSCOPIC CHOLECYSTECTOMY;  Surgeon: Douglas A Blackman, MD;  Location: [facility] OR;  Service: General;  Laterality: N/A;  . LAPAROSCOPY     2 times  . 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 WISDOM TOOTH EXTRACTION      Family History  Problem Relation Age of Onset  . Cancer Mother        cervical  . Heart disease Father   . Cancer Maternal Grandfather        lung  . Cancer Paternal Grandmother        skin  . Cancer Paternal Grandfather        skin   Social History:  reports that she has never smoked. She has never used smokeless tobacco. She reports that she does not drink alcohol or use drugs.  Allergies:  Allergies  Allergen Reactions  . Amoxicillin Hives and Other (See Comments)    syncope  . Ceclor [Cefaclor] Other (See Comments) and Hypertension    Increased heart rate  . Claritin [Loratadine] Other (See Comments) and Hypertension    Increased heart rate  . Codeine Nausea And Vomiting  . Sulfur Itching  . Topamax [Topiramate] Other (See Comments)    blistering  . Zithromax [Azithromycin] Hives  Gabapentin Tramadol  Medications Robaxin Norco  Review of Systems  Constitutional: Negative.   HENT: Negative.   Eyes: Negative.   Respiratory:  Negative.   Cardiovascular: Negative.   Gastrointestinal: Positive for nausea.  Genitourinary: Negative.   Musculoskeletal: Positive for joint pain.  Skin: Negative.   Neurological: Positive for sensory change.    There were no vitals taken for this visit. Physical Exam  Constitutional: She is oriented to person, place, and time. She appears well-developed and well-nourished.  HENT:  Head: Normocephalic.  Eyes: Pupils are equal, round, and reactive to light.  Neck: Normal range of motion.  Cardiovascular: Normal rate.  Respiratory: Effort normal.  GI: Soft.  Musculoskeletal:  Patient is a 42-year-old female.  Constitutional: General Appearance: healthy-appearing and NAD.  Gait and Station: Appearance: antalgic gait.  Cardiovascular System: Arterial Pulses Left: femoral normal, popliteal normal, dorsalis pedis normal, and posterior tibialis normal. Edema Left: no edema. Varicosities Left: no varicosities.  Lymph Nodes: Inspection/Palpation Left: no inguinal LAD.  Knees: Inspection Left: no deformity. Bony Palpation Left: no tenderness of the superior pole patella, the inferior pole patella, the tibial tubercle, the medial joint line, the lateral joint line, the medial tibial plateau, Gerdy's tubercle, or the neck of fibula. Soft Tissue Palpation Left: no tenderness of the quadriceps tendon, the prepatellar bursa, the patellar tendon, the medial collateral ligament, or the infrapatellar tendon. Active Range of Motion Left: normal. Stability Left: no laxity or ligamentous instability and anterior drawer sign negative and Lachman test negative.   Special Tests Left: McMurray's test negative. Strength Left: no hamstring weakness or quadriceps weakness and flexion 5/5 and extension 5/5.  Skin: Left Lower Extremity: normal.  Neurologic: Ankle Reflex Left: normal (2). Knee Reflex Left: normal (2). Sensation on the Left: L2 normal, L3 normal, L4 normal, L5 normal, and S1  normal.  Psychiatric: Mood and Affect: active and alert and normal mood.  Moderate effusion on the right.  Neurological: She is alert and oriented to person, place, and time.     Assessment/Plan Patient has a mechanical symptoms and persistent swelling and effusion of the knee with locking and giving way. She has significant patellofemoral arthrosis. And medial joint space narrowing on her x-rays.  We discussed living with her symptoms versus possible knee arthroscopy debridement chondroplasty. And evacuation of loose bodies. He also may have some lateral tilting of the patella and a lateral release may be beneficial as well.  She would like to proceed with that.  I discussed the risk and benefits of knee arthroscopy including no changes in their symptoms worsening in their symptoms DVT, PE, anesthetic complications etc. Surgical possibilities include chondroplasty, microfracture, partial meniscectomy, plica excision etc. We also discussed the possible need for repeat arthroscopy in the future as well as possible continued treatment including corticosteroid injections and possible Visco supplementation. In addition we discussed the possibility of even eventually required a total knee replacement if significant arthritis encountered. Also indicating that it is an outpatient procedure. 1-2 days on crutches. Postoperative DVT prophylaxis with aspirin if tolerated. Follow-up in the office in 2 weeks following the surgery. Possible consideration of formal of supervised physical therapy as well.  She does have underlying comorbidities including fibromyalgia as well as hyperlaxity syndrome.  In addition multiple allergies.  She has had a remote history of a problem with anesthesia 25 years ago but she has had multiple surgeries since I without difficulty.  Allergy to amoxicillin. No history of MRSA exposure. We can utilize clindamycin.  Discussed residual symptoms related to her patellofemoral  arthrosis and may require a partial knee replacement or a full her knee replacement.  Plan right knee arthroscopy, chrondroplasty, possible lateral release  Christine Marney M., PA-C for Dr. Beane 09/08/2017, 12:29 PM   

## 2017-09-12 NOTE — Patient Instructions (Addendum)
Christine Davenport  09/12/2017   Your procedure is scheduled on: Wednesday  09/17/2017  [facility]             Report to admitting at  0830  AM    Call this number if you have problems the morning of surgery [phone]    Remember: Do not eat food or drink liquids :After Midnight.     Take these medicines the morning of surgery with A SIP OF WATER: none                                 You may not have any metal on your body including hair pins and              piercings  Do not wear jewelry, make-up, lotions, powders or perfumes, deodorant             Do not wear nail polish.  Do not shave  48 hours prior to surgery.               Do not bring valuables to the hospital. [facility] IS NOT             RESPONSIBLE   FOR VALUABLES.  Contacts, dentures or bridgework may not be worn into surgery.  Leave suitcase in the car. After surgery it may be brought to your room.     Patients discharged the day of surgery will not be allowed to drive home.  Name and phone number of your driver:Mom -Christine AlexandersRobin Davenport                Please read over the following fact sheets you were given: _____________________________________________________________________             [facility] - Preparing [facility] Before surgery, you can play an important role.  Because skin is not sterile, your skin needs to be as free of germs as possible.  You can reduce the number of germs on your skin by washing with CHG (chlorahexidine gluconate) soap before surgery.  CHG is an antiseptic cleaner which kills germs and bonds with the skin to continue killing germs even after washing. Please DO NOT use if you have an allergy to CHG or antibacterial soaps.  If your skin becomes reddened/irritated stop using the CHG and inform your nurse when you arrive at Short Stay. Do not shave (including legs and underarms) for at least 48 hours prior to the first CHG shower.  You  may shave your face/neck. Please follow these instructions carefully:  1.  Shower with CHG Soap the night before surgery and the  morning of Surgery.  2.  If you choose to wash your hair, wash your hair first as usual with your  normal  shampoo.  3.  After you shampoo, rinse your hair and body thoroughly to remove the  shampoo.                           4.  Use CHG as you would any other liquid soap.  You can apply chg directly  to the skin and wash                       Gently with a scrungie or  clean washcloth.  5.  Apply the CHG Soap to your body ONLY FROM THE NECK DOWN.   Do not use on face/ open                           Wound or open sores. Avoid contact with eyes, ears mouth and genitals (private parts).                       Wash face,  Genitals (private parts) with your normal soap.             6.  Wash thoroughly, paying special attention to the area where your surgery  will be performed.  7.  Thoroughly rinse your body with warm water from the neck down.  8.  DO NOT shower/wash with your normal soap after using and rinsing off  the CHG Soap.                9.  Pat yourself dry with a clean towel.            10.  Wear clean pajamas.            11.  Place clean sheets on your bed the night of your first shower and do not  sleep with pets. Day of Surgery : Do not apply any lotions/deodorants the morning of surgery.  Please wear clean clothes to the hospital/surgery center.  FAILURE TO FOLLOW THESE INSTRUCTIONS MAY RESULT IN THE CANCELLATION OF YOUR SURGERY PATIENT SIGNATURE_________________________________  NURSE SIGNATURE__________________________________  ________________________________________________________________________                        Christine Davenport  An incentive spirometer is a tool that can help keep your lungs clear and active. This tool measures how well you are filling your lungs with each breath. Taking long deep breaths may help reverse or decrease  the chance of developing breathing (pulmonary) problems (especially infection) following:  A long period of time when you are unable to move or be active. BEFORE THE PROCEDURE   If the spirometer includes an indicator to show your best effort, your nurse or respiratory therapist will set it to a desired goal.  If possible, sit up straight or lean slightly forward. Try not to slouch.  Hold the incentive spirometer in an upright position. INSTRUCTIONS FOR USE  1. Sit on the edge of your bed if possible, or sit up as far as you can in bed or on a chair. 2. Hold the incentive spirometer in an upright position. 3. Breathe out normally. 4. Place the mouthpiece in your mouth and seal your lips tightly around it. 5. Breathe in slowly and as deeply as possible, raising the piston or the ball toward the top of the column. 6. Hold your breath for 3-5 seconds or for as long as possible. Allow the piston or ball to fall to the bottom of the column. 7. Remove the mouthpiece from your mouth and breathe out normally. 8. Rest for a few seconds and repeat Steps 1 through 7 at least 10 times every 1-2 hours when you are awake. Take your time and take a few normal breaths between deep breaths. 9. The spirometer may include an indicator to show your best effort. Use the indicator as a goal to work toward during each repetition. 10. After each set of 10 deep breaths, practice coughing to be sure your  lungs are clear. If you have an incision (the cut made at the time of surgery), support your incision when coughing by placing a pillow or rolled up towels firmly against it. Once you are able to get out of bed, walk around indoors and cough well. You may stop using the incentive spirometer when instructed by your caregiver.  RISKS AND COMPLICATIONS  Take your time so you do not get dizzy or light-headed.  If you are in pain, you may need to take or ask for pain medication before doing incentive spirometry. It is  harder to take a deep breath if you are having pain. AFTER USE  Rest and breathe slowly and easily.  It can be helpful to keep track of a log of your progress. Your caregiver can provide you with a simple table to help with this. If you are using the spirometer at home, follow these instructions: SEEK MEDICAL CARE IF:   You are having difficultly using the spirometer.  You have trouble using the spirometer as often as instructed.  Your pain medication is not giving enough relief while using the spirometer.  You develop fever of 100.5 F (38.1 C) or higher. SEEK IMMEDIATE MEDICAL CARE IF:   You cough up bloody sputum that had not been present before.  You develop fever of 102 F (38.9 C) or greater.  You develop worsening pain at or near the incision site. MAKE SURE YOU:   Understand these instructions.  Will watch your condition.  Will get help right away if you are not doing well or get worse. Document Released: 06/10/2006 Document Revised: 04/22/2011 Document Reviewed: 08/11/2006 [facility] Patient Information 2014 [place], [place].   ________________________________________________________________________

## 2017-09-12 NOTE — Progress Notes (Signed)
09/02/2017- Last office visit and EKG on chart from Ladoris GeneLori Swanson[facility]

## 2017-09-15 ENCOUNTER — Encounter (HOSPITAL_COMMUNITY): Payer: Self-pay

## 2017-09-15 ENCOUNTER — Encounter (HOSPITAL_COMMUNITY)
Admission: RE | Admit: 2017-09-15 | Discharge: 2017-09-15 | Disposition: A | Discharge status: Home / Self Care | Payer: 59 | Source: Ambulatory Visit | Attending: Specialist | Admitting: Specialist

## 2017-09-15 ENCOUNTER — Other Ambulatory Visit: Payer: Self-pay

## 2017-09-15 DIAGNOSIS — S83241A Other tear of medial meniscus, current injury, right knee, initial encounter: Secondary | ICD-10-CM | POA: Diagnosis not present

## 2017-09-15 DIAGNOSIS — M94261 Chondromalacia, right knee: Secondary | ICD-10-CM | POA: Diagnosis not present

## 2017-09-15 DIAGNOSIS — Z79899 Other long term (current) drug therapy: Secondary | ICD-10-CM | POA: Diagnosis not present

## 2017-09-15 DIAGNOSIS — K219 Gastro-esophageal reflux disease without esophagitis: Secondary | ICD-10-CM | POA: Diagnosis not present

## 2017-09-15 DIAGNOSIS — I10 Essential (primary) hypertension: Secondary | ICD-10-CM | POA: Diagnosis not present

## 2017-09-15 DIAGNOSIS — X58XXXA Exposure to other specified factors, initial encounter: Secondary | ICD-10-CM | POA: Diagnosis not present

## 2017-09-15 DIAGNOSIS — Q796 Ehlers-Danlos syndrome: Secondary | ICD-10-CM | POA: Diagnosis not present

## 2017-09-15 DIAGNOSIS — M6588 Other synovitis and tenosynovitis, other site: Secondary | ICD-10-CM | POA: Diagnosis not present

## 2017-09-15 HISTORY — DX: Other complications of anesthesia, initial encounter: T88.59XA

## 2017-09-15 HISTORY — DX: Adverse effect of unspecified anesthetic, initial encounter: T41.45XA

## 2017-09-15 LAB — CBC
HCT: 42.7 % (ref 36.0–46.0)
Hemoglobin: 13.9 g/dL (ref 12.0–15.0)
MCH: 27.5 pg (ref 26.0–34.0)
MCHC: 32.6 g/dL (ref 30.0–36.0)
MCV: 84.4 fL (ref 78.0–100.0)
PLATELETS: 232 10*3/uL (ref 150–400)
RBC: 5.06 MIL/uL (ref 3.87–5.11)
RDW: 13.5 % (ref 11.5–15.5)
WBC: 7.6 10*3/uL (ref 4.0–10.5)

## 2017-09-15 LAB — BASIC METABOLIC PANEL
Anion gap: 8 (ref 5–15)
BUN: 19 mg/dL (ref 6–20)
CO2: 25 mmol/L (ref 22–32)
CREATININE: 0.64 mg/dL (ref 0.44–1.00)
Calcium: 8.8 mg/dL — ABNORMAL LOW (ref 8.9–10.3)
Chloride: 109 mmol/L (ref 98–111)
GFR calc Af Amer: >60 mL/min (ref >60)
GFR calc non Af Amer: >60 mL/min (ref >60)
GLUCOSE: 96 mg/dL (ref 70–99)
Potassium: 4 mmol/L (ref 3.5–5.1)
Sodium: 142 mmol/L (ref 135–145)

## 2017-09-15 LAB — HCG, SERUM, QUALITATIVE: PREG SERUM: NEGATIVE

## 2017-09-17 ENCOUNTER — Encounter (HOSPITAL_COMMUNITY)
Admission: RE | Disposition: A | Discharge status: Home / Self Care | Payer: Self-pay | Source: Ambulatory Visit | Attending: Specialist

## 2017-09-17 ENCOUNTER — Ambulatory Visit (HOSPITAL_COMMUNITY): Payer: 59 | Admitting: Anesthesiology

## 2017-09-17 ENCOUNTER — Encounter (HOSPITAL_COMMUNITY): Payer: Self-pay | Admitting: General Practice

## 2017-09-17 ENCOUNTER — Ambulatory Visit (HOSPITAL_COMMUNITY)
Admission: RE | Admit: 2017-09-17 | Discharge: 2017-09-17 | Disposition: A | Discharge status: Home / Self Care | Payer: 59 | Source: Ambulatory Visit | Attending: Specialist | Admitting: Specialist

## 2017-09-17 DIAGNOSIS — I10 Essential (primary) hypertension: Secondary | ICD-10-CM | POA: Diagnosis not present

## 2017-09-17 DIAGNOSIS — M94261 Chondromalacia, right knee: Secondary | ICD-10-CM | POA: Insufficient documentation

## 2017-09-17 DIAGNOSIS — S83241A Other tear of medial meniscus, current injury, right knee, initial encounter: Secondary | ICD-10-CM | POA: Insufficient documentation

## 2017-09-17 DIAGNOSIS — Z79899 Other long term (current) drug therapy: Secondary | ICD-10-CM | POA: Insufficient documentation

## 2017-09-17 DIAGNOSIS — X58XXXA Exposure to other specified factors, initial encounter: Secondary | ICD-10-CM | POA: Insufficient documentation

## 2017-09-17 DIAGNOSIS — M1732 Unilateral post-traumatic osteoarthritis, left knee: Secondary | ICD-10-CM

## 2017-09-17 DIAGNOSIS — Q796 Ehlers-Danlos syndrome: Secondary | ICD-10-CM | POA: Insufficient documentation

## 2017-09-17 DIAGNOSIS — M65861 Other synovitis and tenosynovitis, right lower leg: Secondary | ICD-10-CM | POA: Diagnosis not present

## 2017-09-17 DIAGNOSIS — M6588 Other synovitis and tenosynovitis, other site: Secondary | ICD-10-CM | POA: Insufficient documentation

## 2017-09-17 DIAGNOSIS — K219 Gastro-esophageal reflux disease without esophagitis: Secondary | ICD-10-CM | POA: Insufficient documentation

## 2017-09-17 DIAGNOSIS — M2241 Chondromalacia patellae, right knee: Secondary | ICD-10-CM | POA: Diagnosis not present

## 2017-09-17 DIAGNOSIS — M1712 Unilateral primary osteoarthritis, left knee: Secondary | ICD-10-CM

## 2017-09-17 HISTORY — PX: KNEE ARTHROSCOPY WITH LATERAL RELEASE: SHX5649

## 2017-09-17 SURGERY — ARTHROSCOPY, KNEE, WITH LATERAL RETINACULUM RELEASE
Anesthesia: General | Site: Knee | Laterality: Right

## 2017-09-17 MED ORDER — LACTATED RINGERS IV SOLN
INTRAVENOUS | Status: DC
  Administered 2017-09-17 (×2): via INTRAVENOUS

## 2017-09-17 MED ORDER — EPHEDRINE SULFATE-NACL 50-0.9 MG/10ML-% IV SOSY
PREFILLED_SYRINGE | INTRAVENOUS | Status: DC | PRN
  Administered 2017-09-17: 10 mg via INTRAVENOUS

## 2017-09-17 MED ORDER — EPHEDRINE 5 MG/ML INJ
INTRAVENOUS | Status: AC
  Filled 2017-09-17: qty 10

## 2017-09-17 MED ORDER — HYDROMORPHONE HCL 1 MG/ML IJ SOLN
INTRAMUSCULAR | Status: AC
  Administered 2017-09-17: 0.5 mg via INTRAVENOUS
  Filled 2017-09-17: qty 1

## 2017-09-17 MED ORDER — MEPERIDINE HCL 50 MG/ML IJ SOLN: 6.2500 mg | INTRAMUSCULAR | Status: DC | PRN

## 2017-09-17 MED ORDER — LACTATED RINGERS IR SOLN
Status: DC | PRN
  Administered 2017-09-17: 6000 mL

## 2017-09-17 MED ORDER — ONDANSETRON HCL 4 MG/2ML IJ SOLN
INTRAMUSCULAR | Status: DC | PRN
  Administered 2017-09-17: 4 mg via INTRAVENOUS

## 2017-09-17 MED ORDER — DOCUSATE SODIUM 100 MG PO CAPS: 100.0000 mg | ORAL_CAPSULE | Freq: Two times a day (BID) | ORAL | 0 refills | Status: AC | PRN

## 2017-09-17 MED ORDER — MIDAZOLAM HCL 5 MG/5ML IJ SOLN
INTRAMUSCULAR | Status: DC | PRN
  Administered 2017-09-17: 2 mg via INTRAVENOUS

## 2017-09-17 MED ORDER — ONDANSETRON HCL 4 MG/2ML IJ SOLN
INTRAMUSCULAR | Status: AC
  Filled 2017-09-17: qty 2

## 2017-09-17 MED ORDER — MIDAZOLAM HCL 2 MG/2ML IJ SOLN
INTRAMUSCULAR | Status: AC
  Filled 2017-09-17: qty 2

## 2017-09-17 MED ORDER — HYDROMORPHONE HCL 1 MG/ML IJ SOLN
INTRAMUSCULAR | Status: AC
  Administered 2017-09-17: 0.25 mg via INTRAVENOUS
  Filled 2017-09-17: qty 1

## 2017-09-17 MED ORDER — PHENYLEPHRINE 40 MCG/ML (10ML) SYRINGE FOR IV PUSH (FOR BLOOD PRESSURE SUPPORT)
PREFILLED_SYRINGE | INTRAVENOUS | Status: AC
  Filled 2017-09-17: qty 10

## 2017-09-17 MED ORDER — LIDOCAINE 2% (20 MG/ML) 5 ML SYRINGE
INTRAMUSCULAR | Status: DC | PRN
  Administered 2017-09-17: 60 mg via INTRAVENOUS

## 2017-09-17 MED ORDER — CLINDAMYCIN PHOSPHATE 900 MG/50ML IV SOLN
900.0000 mg | INTRAVENOUS | Status: AC
  Administered 2017-09-17: 900 mg via INTRAVENOUS
  Filled 2017-09-17: qty 50

## 2017-09-17 MED ORDER — EPINEPHRINE PF 1 MG/ML IJ SOLN
INTRAMUSCULAR | Status: DC | PRN
  Administered 2017-09-17: 1 mg

## 2017-09-17 MED ORDER — BUPIVACAINE-EPINEPHRINE (PF) 0.5% -1:200000 IJ SOLN
INTRAMUSCULAR | Status: AC
  Filled 2017-09-17: qty 30

## 2017-09-17 MED ORDER — OXYCODONE HCL 5 MG PO TABS: 5.0000 mg | ORAL_TABLET | ORAL | 0 refills | Status: AC | PRN

## 2017-09-17 MED ORDER — EPINEPHRINE PF 1 MG/ML IJ SOLN
INTRAMUSCULAR | Status: AC
  Filled 2017-09-17: qty 2

## 2017-09-17 MED ORDER — FENTANYL CITRATE (PF) 250 MCG/5ML IJ SOLN
INTRAMUSCULAR | Status: AC
  Filled 2017-09-17: qty 5

## 2017-09-17 MED ORDER — METHOCARBAMOL 500 MG IVPB - SIMPLE MED
500.0000 mg | Freq: Once | INTRAVENOUS | Status: AC
  Administered 2017-09-17: 500 mg via INTRAVENOUS

## 2017-09-17 MED ORDER — CHLORHEXIDINE GLUCONATE 4 % EX LIQD: 60.0000 mL | Freq: Once | CUTANEOUS | Status: DC

## 2017-09-17 MED ORDER — BUPIVACAINE-EPINEPHRINE 0.5% -1:200000 IJ SOLN
INTRAMUSCULAR | Status: DC | PRN
  Administered 2017-09-17: 50 mL
  Administered 2017-09-17: 10 mL

## 2017-09-17 MED ORDER — ONDANSETRON HCL 4 MG/2ML IJ SOLN
INTRAMUSCULAR | Status: AC
  Administered 2017-09-17: 4 mg via INTRAVENOUS
  Filled 2017-09-17: qty 2

## 2017-09-17 MED ORDER — OXYCODONE HCL 5 MG PO TABS
5.0000 mg | ORAL_TABLET | Freq: Once | ORAL | Status: AC | PRN
  Administered 2017-09-17: 5 mg via ORAL

## 2017-09-17 MED ORDER — METHOCARBAMOL 500 MG IVPB - SIMPLE MED
INTRAVENOUS | Status: AC
  Administered 2017-09-17: 500 mg via INTRAVENOUS
  Filled 2017-09-17: qty 50

## 2017-09-17 MED ORDER — HYDROMORPHONE HCL 1 MG/ML IJ SOLN
0.2500 mg | INTRAMUSCULAR | Status: DC | PRN
  Administered 2017-09-17: 0.25 mg via INTRAVENOUS
  Administered 2017-09-17 (×2): 0.5 mg via INTRAVENOUS
  Administered 2017-09-17: 0.25 mg via INTRAVENOUS

## 2017-09-17 MED ORDER — FENTANYL CITRATE (PF) 100 MCG/2ML IJ SOLN
INTRAMUSCULAR | Status: DC | PRN
  Administered 2017-09-17: 100 ug via INTRAVENOUS

## 2017-09-17 MED ORDER — OXYCODONE HCL 5 MG PO TABS
ORAL_TABLET | ORAL | Status: AC
  Administered 2017-09-17: 5 mg via ORAL
  Filled 2017-09-17: qty 1

## 2017-09-17 MED ORDER — ONDANSETRON HCL 4 MG/2ML IJ SOLN
4.0000 mg | Freq: Once | INTRAMUSCULAR | Status: AC | PRN
  Administered 2017-09-17: 4 mg via INTRAVENOUS

## 2017-09-17 MED ORDER — PROPOFOL 10 MG/ML IV BOLUS
INTRAVENOUS | Status: AC
  Filled 2017-09-17: qty 20

## 2017-09-17 MED ORDER — PROPOFOL 10 MG/ML IV BOLUS
INTRAVENOUS | Status: DC | PRN
  Administered 2017-09-17: 120 mg via INTRAVENOUS

## 2017-09-17 SURGICAL SUPPLY — 27 items
BANDAGE ACE 6X5 VEL STRL LF (GAUZE/BANDAGES/DRESSINGS) ×2 IMPLANT
BLADE 4.2CUDA (BLADE) IMPLANT
BLADE CUDA SHAVER 3.5 (BLADE) ×2 IMPLANT
BOOTIES KNEE HIGH SLOAN (MISCELLANEOUS) ×2 IMPLANT
CLOTH 2% CHLOROHEXIDINE 3PK (PERSONAL CARE ITEMS) ×2 IMPLANT
COVER SURGICAL LIGHT HANDLE (MISCELLANEOUS) ×2 IMPLANT
DRSG EMULSION OIL 3X3 NADH (GAUZE/BANDAGES/DRESSINGS) ×2 IMPLANT
DRSG PAD ABDOMINAL 8X10 ST (GAUZE/BANDAGES/DRESSINGS) ×2 IMPLANT
DURAPREP 26ML APPLICATOR (WOUND CARE) ×2 IMPLANT
GAUZE SPONGE 4X4 12PLY STRL (GAUZE/BANDAGES/DRESSINGS) ×2 IMPLANT
GLOVE BIOGEL PI IND STRL 7.5 (GLOVE) IMPLANT
GLOVE BIOGEL PI INDICATOR 7.5 (GLOVE)
GLOVE SURG SS PI 7.5 STRL IVOR (GLOVE) ×2 IMPLANT
GLOVE SURG SS PI 8.0 STRL IVOR (GLOVE) ×2 IMPLANT
GOWN STRL REUS W/TWL XL LVL3 (GOWN DISPOSABLE) ×2 IMPLANT
KIT BASIN OR (CUSTOM PROCEDURE TRAY) ×2 IMPLANT
MANIFOLD NEPTUNE II (INSTRUMENTS) ×2 IMPLANT
PACK ARTHROSCOPY WL (CUSTOM PROCEDURE TRAY) ×2 IMPLANT
PAD ABD 8X10 STRL (GAUZE/BANDAGES/DRESSINGS) ×2 IMPLANT
PADDING CAST COTTON 6X4 STRL (CAST SUPPLIES) ×2 IMPLANT
SHAVER 4.2 MM LANZA 9391A (BLADE) ×2 IMPLANT
SUT ETHILON 4 0 PS 2 18 (SUTURE) ×2 IMPLANT
TOWEL OR 17X26 10 PK STRL BLUE (TOWEL DISPOSABLE) ×2 IMPLANT
TUBING ARTHRO INFLOW-ONLY STRL (TUBING) ×2 IMPLANT
WAND HAND CNTRL MULTIVAC 50 (MISCELLANEOUS) IMPLANT
WAND HAND CNTRL MULTIVAC 90 (MISCELLANEOUS) IMPLANT
WRAP KNEE MAXI GEL POST OP (GAUZE/BANDAGES/DRESSINGS) ×2 IMPLANT

## 2017-09-17 NOTE — Op Note (Signed)
NAMShelbie Davenport: [place], [place] MEDICAL RECORD [id number] ACCOUNT [id number] DATE OF BIRTH:Nov 16, 1975 FACILITY: [facility] LOCATION: [facility] PHYSICIAN:Leiah Giannotti Connye Burkitt. Ericah Scotto, MD  OPERATIVE REPORT  DATE OF PROCEDURE:  09/17/2017  PREOPERATIVE DIAGNOSES:  Patellofemoral chondromalacia, medial meniscus tear, hypertrophic synovitis, medial compartment, hyperlaxity.  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  PROCEDURE PERFORMED: 1.  Right knee arthroscopy. 2.  Partial medial meniscectomy. 3.  Synovectomy medial compartment and chondroplasty of the patellofemoral sulcus and the medial femoral condyle.    INDICATIONS:  A 42 year old with knee pain, hyperlaxity, medial joint pain, patellofemoral pain, MRI indicating significant patellofemoral arthrosis.  She had medial symptoms as well as mechanical symptoms.  We discussed some occult meniscus tear, loose  body or chondral flap tear, failing conservative treatment, unable to use cortisone injections were discussed.  Exam under anesthesia, knee arthroscopy, chondroplasty and evaluation of the medial compartment, partial meniscectomy.  Risks and benefits  discussed including bleeding, infection, damage to neurovascular structures, no change in symptoms, worsening of symptoms, DVT, PE, anesthetic complications, etc.  DESCRIPTION OF PROCEDURE:  With the patient in supine position and after induction of adequate general anesthesia and 2 g of Kefzol, the right lower extremity was prepped and draped in the usual sterile fashion.  She had hyperlaxity in terms of  recurvatum on exam.  She had a negative anterior and posterior drawer, however.  Lateral parapatellar portal was fashioned with a #11 blade and gross cannula atraumatically placed.  Irrigant was utilized to insufflate the joint.  Under direct  visualization, a  medial parapatellar portal was fashioned with a #11 blade after localization with an 18-gauge needle, sparing the medial meniscus, noting as well that the patient had a patella alta.  Irrigant was utilized to insufflate the joint again  at 65 mmHg.  Inspection of the medial compartment revealed a chondral flap tear of the medial femoral condyle with some grade III changes.  A small tear of the medial meniscus, more on the anterior third.  Hypertrophic synovitis.  Introduced a shaver and  performed a medial meniscectomy to a stable base and excised the hypertrophic synovitis, and a light chondroplasty was performed in the femoral condyle.  The meniscus remnant was stable to probe palpation.  ACL was unremarkable.  Lateral compartment revealed normal femoral condyle, tibial plateau and meniscus were stable to probe palpation without evidence of pathology.  Suprapatellar pouch revealed extensive grade II changes of primarily the patella and some of the sulcus.  Light chondroplasty was performed to both.  I did find normal patellofemoral tracking.  The gutters were unremarkable.  No evidence of a plica or  other pathology within that medial compartment where symptoms were.  Revisited all compartments.  No further pathology amenable to arthroscopic intervention.  I therefore removed all instrumentation, closed the portals with 4-0 nylon simple sutures.  Marcaine 0.25% with epinephrine was infiltrated in the joint.  Wound was  dressed sterilely, awoken without difficulty and transported to the recovery room in satisfactory condition.  The patient tolerated the procedure well.  There were no complications.  Andrez GrimeJaclyn Bissell, [place].  Minimal blood loss.  LN/NUANCE  D:09/17/2017 T:09/17/2017 JOB:[id number]

## 2017-09-17 NOTE — Interval H&P Note (Signed)
History and Physical Interval Note:  09/17/2017 8:28 AM  Christine Proctorhristine Horacek  has presented today for surgery, with the diagnosis of Right knee chondromalacia  The various methods of treatment have been discussed with the patient and family. After consideration of risks, benefits and other options for treatment, the patient has consented to  Procedure(s) with comments: Right knee arthroscopy chondroplasty possible lateral release (Right) - 60 mins as a surgical intervention .  The patient's history has been reviewed, patient examined, no change in status, stable for surgery.  I have reviewed the patient's chart and labs.  Questions were answered to the patient's satisfaction.     Sapna Padron C

## 2017-09-17 NOTE — Brief Op Note (Signed)
09/17/2017  11:56 AM  PATIENT:  Christine Davenport  42 y.o. female  PRE-OPERATIVE DIAGNOSIS:  Right knee chondromalacia  POST-OPERATIVE DIAGNOSIS:  Right knee chondromalacia  PROCEDURE:  Procedure(s) with comments: Right knee arthroscopy chondroplasty debridement (Right) - 60 mins  SURGEON:  Surgeon(s) and Role:    Jene Every* Takeisha Cianci, MD - Primary  PHYSICIAN ASSISTANT:   ASSISTANTS: Bissell   ANESTHESIA:   general  EBL:  min   BLOOD ADMINISTERED:none  DRAINS: none   LOCAL MEDICATIONS USED:  MARCAINE     SPECIMEN:  No Specimen  DISPOSITION OF SPECIMEN:  N/A  COUNTS:  YES  TOURNIQUET:  * No tourniquets in log *  DICTATION: .Other Dictation: Dictation Number [id number]  PLAN OF CARE: Discharge to home after PACU  PATIENT DISPOSITION:  PACU - hemodynamically stable.   Delay start of Pharmacological VTE agent (>24hrs) due to surgical blood loss or risk of bleeding: no

## 2017-09-17 NOTE — Anesthesia Procedure Notes (Signed)
Procedure Name: LMA Insertion Performed by: Alexes Menchaca J, CRNA Pre-anesthesia Checklist: Patient identified, Emergency Drugs available, Suction available, Patient being monitored and Timeout performed Patient Re-evaluated:Patient Re-evaluated prior to induction Oxygen Delivery Method: Circle system utilized Preoxygenation: Pre-oxygenation with 100% oxygen Induction Type: IV induction Ventilation: Mask ventilation without difficulty LMA: LMA inserted LMA Size: 4.0 Number of attempts: 1 Placement Confirmation: ETT inserted through vocal cords under direct vision,  positive ETCO2 and breath sounds checked- equal and bilateral Tube secured with: Tape Dental Injury: Teeth and Oropharynx as per pre-operative assessment        

## 2017-09-17 NOTE — Anesthesia Preprocedure Evaluation (Signed)
Anesthesia Evaluation  Patient identified by MRN, date of birth, ID band Patient awake    Reviewed: Allergy & Precautions, NPO status , Patient's Chart, lab work & pertinent test results  Airway Mallampati: I  TM Distance: >3 FB Neck ROM: Full    Dental   Pulmonary    Pulmonary exam normal        Cardiovascular hypertension, Pt. on medications Normal cardiovascular exam     Neuro/Psych    GI/Hepatic   Endo/Other    Renal/GU      Musculoskeletal   Abdominal   Peds  Hematology   Anesthesia Other Findings   Reproductive/Obstetrics                             Anesthesia Physical Anesthesia Plan  ASA: II  Anesthesia Plan: General   Post-op Pain Management:    Induction: Intravenous  PONV Risk Score and Plan: 3 and Ondansetron, Dexamethasone and Midazolam  Airway Management Planned: LMA  Additional Equipment:   Intra-op Plan:   Post-operative Plan: Extubation in OR  Informed Consent: I have reviewed the patients History and Physical, chart, labs and discussed the procedure including the risks, benefits and alternatives for the proposed anesthesia with the patient or authorized representative who has indicated his/her understanding and acceptance.     Plan Discussed with: CRNA and Surgeon  Anesthesia Plan Comments:         Anesthesia Quick Evaluation

## 2017-09-17 NOTE — Discharge Instructions (Signed)
ARTHROSCOPIC KNEE SURGERY HOME CARE INSTRUCTIONS   PAIN You will be expected to have a moderate amount of pain in the affected knee for approximately two weeks.  However, the first two to four days will be the most severe in terms of the pain you will experience.  Prescriptions have been provided for you to take as needed for the pain.  The pain can be markedly reduced by using the ice/compressive bandage given.  Exchange the ice packs whenever they thaw.  During the night, keep the bandage on because it will still provide some compression for the swelling.  Also, keep the leg elevated on pillows above your heart, and this will help alleviate the pain and swelling.  MEDICATION Prescriptions have been provided to take as needed for pain. To prevent blood clots, take Aspirin 325mg daily with a meal if not on a blood thinner and if no history of stomach ulcers.  ACTIVITY It is preferred that you stay on bedrest for approximately 24 hours.  However, you may go to the bathroom with help.  After this, you can start to be up and about progressively more.  Remember that the swelling may still increase after three to four days if you are up and doing too much.  You may put as much weight on the affected leg as pain will allow.  Use your crutches for comfort and safety.  However, as soon as you are able, you may discard the crutches and go without them.   DRESSING Keep the current dressing as dry as possible.  Two days after your surgery, you may remove the ice/compressive wrap, and surgical dressing.  You may now take a shower, but do not scrub the sounds directly with soap.  Let water rinse over these and gently wipe with your hand.  Reapply band-aids over the puncture wounds and more gauze if needed.  A slight amount of thin drainage can be normal at this time, and do not let it frighten you.  Reapply the ice/compressive wrap.  You may now repeat this every day each time you shower.  SYMPTOMS TO REPORT TO  YOUR DOCTOR  -Extreme pain.  -Extreme swelling.  -Temperature above 101 degrees that does not come down with acetaminophen     (Tylenol).  -Any changes in the feeling, color or movement of your toes.  -Extreme redness, heat, swelling or drainage at your incision  EXERCISE It is preferred that you begin to exercise on the day of your surgery.  Straight leg raises and short arc quads should be begun the afternoon or evening of surgery and continued until you come back for your follow-up appointment.   Attached is an instruction sheet on how to perform these two simple exercises.  Do these at least three times per day if not more.  You may bend your knee as much as is comfortable.  The puncture wounds may occasionally be slightly uncomfortable with bending of the knee.  Do not let this frighten you.  It is important to keep your knee motion, but do not overdo it.  If you have significant pain, simply do not bend the knee as far.   You will be given more exercises to perform at your first return visit.    RETURN APPOINTMENT Please make an appointment to be seen by your doctor in 10-14 days from your surgery.  Patient Signature:  ________________________________________________________  Nurse's Signature:  ________________________________________________________ 

## 2017-09-17 NOTE — Transfer of Care (Signed)
Immediate Anesthesia Transfer of Care Note  Patient: Shelbie ProctorChristine Carneal  Procedure(s) Performed: Right knee arthroscopy chondroplasty debridement (Right Knee)  Patient Location: PACU  Anesthesia Type:General  Level of Consciousness: awake, alert  and oriented  Airway & Oxygen Therapy: Patient Spontanous Breathing and Patient connected to face mask oxygen  Post-op Assessment: Report given to RN and Post -op Vital signs reviewed and stable  Post vital signs: Reviewed and stable  Last Vitals:  Vitals Value Taken Time  BP 156/95 09/17/2017 12:08 PM  Temp    Pulse 78 09/17/2017 12:10 PM  Resp 13 09/17/2017 12:10 PM  SpO2 100 % 09/17/2017 12:10 PM  Vitals shown include unvalidated device data.  Last Pain:  Vitals:   09/17/17 0854  TempSrc:   PainSc: 5          Complications: No apparent anesthesia complications

## 2017-09-17 NOTE — Anesthesia Postprocedure Evaluation (Signed)
Anesthesia Post Note  Patient: Christine ProctorChristine Davenport  Procedure(s) Performed: Right knee arthroscopy chondroplasty debridement (Right Knee)     Patient location during evaluation: PACU Anesthesia Type: General Level of consciousness: awake and alert Pain management: pain level controlled Vital Signs Assessment: post-procedure vital signs reviewed and stable Respiratory status: spontaneous breathing, nonlabored ventilation, respiratory function stable and patient connected to nasal cannula oxygen Cardiovascular status: blood pressure returned to baseline and stable Postop Assessment: no apparent nausea or vomiting Anesthetic complications: no    Last Vitals:  Vitals:   09/17/17 1330 09/17/17 1519  BP: 124/76 122/74  Pulse: 75 75  Resp: 15 14  Temp: 36.7 C 36.7 C  SpO2: 96% 97%    Last Pain:  Vitals:   09/17/17 1519  TempSrc:   PainSc: 4                  [name] DAVID

## 2017-09-18 ENCOUNTER — Encounter (HOSPITAL_COMMUNITY): Payer: Self-pay | Admitting: Specialist

## 2017-10-07 ENCOUNTER — Ambulatory Visit: Payer: Self-pay | Admitting: Orthopedic Surgery

## 2017-10-27 DIAGNOSIS — Z6841 Body Mass Index (BMI) 40.0 and over, adult: Secondary | ICD-10-CM | POA: Diagnosis not present

## 2017-10-27 DIAGNOSIS — Q796 Ehlers-Danlos syndrome: Secondary | ICD-10-CM | POA: Diagnosis not present

## 2017-10-27 DIAGNOSIS — M1712 Unilateral primary osteoarthritis, left knee: Secondary | ICD-10-CM | POA: Diagnosis not present

## 2017-10-30 ENCOUNTER — Ambulatory Visit: Payer: Self-pay | Admitting: Orthopedic Surgery

## 2017-10-30 NOTE — H&P (View-Only) (Signed)
Christine Davenport is an 42 y.o. female.   Chief Complaint: L knee pain HPI: chronic L knee pain, crepitus, pain with ROM, instability symptoms. Same symptoms were present on the right pre-op and have resolved with arthroscopy. Unable to have cortisone.  Past Medical History:  Diagnosis Date  . Complication of anesthesia    in 1990's , during cyst removal on ankle, Physician informed her that before they intubated her, her lungs collapsed and to quickly intubate her and her throat may be sore for a while! No problems since then!  . Fibromyalgia   . Hyperlipidemia   . Hypertension     Past Surgical History:  Procedure Laterality Date  . BREAST SURGERY     reduction  . CHOLECYSTECTOMY N/A 06/02/2013   Procedure: LAPAROSCOPIC CHOLECYSTECTOMY;  Surgeon: Shelly Rubensteinouglas A Blackman, MD;  Location: [facility] OR;  Service: General;  Laterality: N/A;  . KNEE ARTHROSCOPY WITH LATERAL RELEASE Right 09/17/2017   Procedure: Right knee arthroscopy chondroplasty debridement;  Surgeon: Jene EveryBeane, Jeffrey, MD;  Location: [facility];  Service: Orthopedics;  Laterality: Right;  60 mins  . LAPAROSCOPY     2 times  . 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 WISDOM TOOTH EXTRACTION      Family History  Problem Relation Age of Onset  . Cancer Mother        cervical  . Heart disease Father   . Cancer Maternal Grandfather        lung  . Cancer Paternal Grandmother        skin  . Cancer Paternal Grandfather        skin   Social History:  reports that she has never smoked. She has never used smokeless tobacco. She reports that she does not drink alcohol or use drugs.  Allergies:  Allergies  Allergen Reactions  . Cortisone Anaphylaxis  . Gabapentin Swelling    Oral Not effective, caused joint swelling Can tolerate topical  . Amoxicillin Hives and Nausea And Vomiting  . Ceclor [Cefaclor] Other (See Comments) and Hypertension    Increased heart rate  . Claritin [Loratadine] Other (See Comments) and Hypertension     Increased heart rate  . Codeine Nausea And Vomiting  . Topamax [Topiramate] Other (See Comments)    blistering  . Zithromax [Azithromycin] Hives  . Sulfur Itching and Rash     (Not in a hospital admission)  No results found for this or any previous visit (from the past 48 hour(s)). No results found.  Review of Systems  Constitutional: Negative.   HENT: Negative.   Eyes: Negative.   Respiratory: Negative.   Cardiovascular: Negative.   Gastrointestinal: Negative.   Genitourinary: Negative.   Musculoskeletal: Positive for joint pain.  Skin: Negative.   Neurological: Negative.     There were no vitals taken for this visit. Physical Exam  Constitutional: She is oriented to person, place, and time. She appears well-developed.  HENT:  Head: Normocephalic.  Eyes: Pupils are equal, round, and reactive to light.  Neck: Normal range of motion.  Cardiovascular: Normal rate.  Respiratory: Effort normal.  GI: Soft.  Musculoskeletal:  Left knee she has trace effusion. She has good tracking of the patella she has severe patellofemoral crepitus and patellofemoral pain with compression. No instability with varus and valgus stressing at 0 and 30. Negative anterior drawer. Negative McMurray. Negative Lachman. Ipsilateral hip and ankle exam is unremarkable.  Neurological: She is alert and oriented to person, place, and time.  Skin: Skin is warm and dry.     Assessment/Plan Patient is doing well status post knee arthroscopy debridement chondroplasty and partial meniscectomy.  The patient reports identical symptoms on the left in terms of her patellofemoral crepitus.  Given her positive response to the surgery on the right she would like to proceed with the same surgery on the left. We discussed knee arthroscopy. Risks and benefits.  Mainly patellofemoral. Evaluate the tracking of the patella.  She had an MRI on the right. At this point time I do not feel it iscompletely necessary  to obtain it for the left given her symptomatology. And the audible crepitus. She would rather not proceed with it as well.  She would like to proceed sometime in the fall with a left knee arthroscopy.  Plan Left knee arthroscopy, debridement, chondroplasty, possible lateral release  Christine Davenport, Dayna Barker., PA-C for Dr. Shelle Iron 10/30/2017, 5:02 PM

## 2017-10-30 NOTE — H&P (Signed)
Christine Davenport is an 42 y.o. female.   Chief Complaint: L knee pain HPI: chronic L knee pain, crepitus, pain with ROM, instability symptoms. Same symptoms were present on the right pre-op and have resolved with arthroscopy. Unable to have cortisone.  Past Medical History:  Diagnosis Date  . Complication of anesthesia    in 1990's , during cyst removal on ankle, Physician informed her that before they intubated her, her lungs collapsed and to quickly intubate her and her throat may be sore for a while! No problems since then!  . Fibromyalgia   . Hyperlipidemia   . Hypertension     Past Surgical History:  Procedure Laterality Date  . BREAST SURGERY     reduction  . CHOLECYSTECTOMY N/A 06/02/2013   Procedure: LAPAROSCOPIC CHOLECYSTECTOMY;  Surgeon: Christine A Blackman, MD;  Location: [facility] OR;  Service: General;  Laterality: N/A;  . KNEE ARTHROSCOPY WITH LATERAL RELEASE Right 09/17/2017   Procedure: Right knee arthroscopy chondroplasty debridement;  Surgeon: Davenport, Jeffrey, MD;  Location: [facility];  Service: Orthopedics;  Laterality: Right;  60 mins  . LAPAROSCOPY     2 times  . 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 WISDOM TOOTH EXTRACTION      Family History  Problem Relation Age of Onset  . Cancer Mother        cervical  . Heart disease Father   . Cancer Maternal Grandfather        lung  . Cancer Paternal Grandmother        skin  . Cancer Paternal Grandfather        skin   Social History:  reports that she has never smoked. She has never used smokeless tobacco. She reports that she does not drink alcohol or use drugs.  Allergies:  Allergies  Allergen Reactions  . Cortisone Anaphylaxis  . Gabapentin Swelling    Oral Not effective, caused joint swelling Can tolerate topical  . Amoxicillin Hives and Nausea And Vomiting  . Ceclor [Cefaclor] Other (See Comments) and Hypertension    Increased heart rate  . Claritin [Loratadine] Other (See Comments) and Hypertension     Increased heart rate  . Codeine Nausea And Vomiting  . Topamax [Topiramate] Other (See Comments)    blistering  . Zithromax [Azithromycin] Hives  . Sulfur Itching and Rash     (Not in a hospital admission)  No results found for this or any previous visit (from the past 48 hour(s)). No results found.  Review of Systems  Constitutional: Negative.   HENT: Negative.   Eyes: Negative.   Respiratory: Negative.   Cardiovascular: Negative.   Gastrointestinal: Negative.   Genitourinary: Negative.   Musculoskeletal: Positive for joint pain.  Skin: Negative.   Neurological: Negative.     There were no vitals taken for this visit. Physical Exam  Constitutional: She is oriented to person, place, and time. She appears well-developed.  HENT:  Head: Normocephalic.  Eyes: Pupils are equal, round, and reactive to light.  Neck: Normal range of motion.  Cardiovascular: Normal rate.  Respiratory: Effort normal.  GI: Soft.  Musculoskeletal:  Left knee she has trace effusion. She has good tracking of the patella she has severe patellofemoral crepitus and patellofemoral pain with compression. No instability with varus and valgus stressing at 0 and 30. Negative anterior drawer. Negative McMurray. Negative Lachman. Ipsilateral hip and ankle exam is unremarkable.  Neurological: She is alert and oriented to person, place, and time.    Skin: Skin is warm and dry.     Assessment/Plan Patient is doing well status post knee arthroscopy debridement chondroplasty and partial meniscectomy.  The patient reports identical symptoms on the left in terms of her patellofemoral crepitus.  Given her positive response to the surgery on the right she would like to proceed with the same surgery on the left. We discussed knee arthroscopy. Risks and benefits.  Mainly patellofemoral. Evaluate the tracking of the patella.  She had an MRI on the right. At this point time I do not feel it iscompletely necessary  to obtain it for the left given her symptomatology. And the audible crepitus. She would rather not proceed with it as well.  She would like to proceed sometime in the fall with a left knee arthroscopy.  Plan Left knee arthroscopy, debridement, chondroplasty, possible lateral release  Christine Davenport M., PA-C for Dr. Beane 10/30/2017, 5:02 PM   

## 2017-11-07 NOTE — Patient Instructions (Addendum)
Christine Davenport  11/07/2017   Your procedure is scheduled on: 11-19-17  Report to [facility] Main  Entrance  Report to admitting at 630 AM    Call this number if you have problems the morning of surgery [phone]   Remember: Do not eat food or drink liquids :After Midnight. BRUSH YOUR TEETH MORNING OF SURGERY AND RINSE YOUR MOUTH OUT, NO CHEWING GUM CANDY OR MINTS.     Take these medicines the morning of surgery with A SIP OF WATER:  TYLENOL IF NEEDED                              You may not have any metal on your body including hair pins and              piercings  Do not wear jewelry, make-up, lotions, powders or perfumes, deodorant             Do not wear nail polish.  Do not shave  48 hours prior to surgery.              Men may shave face and neck.   Do not bring valuables to the hospital. [facility] IS NOT             RESPONSIBLE   FOR VALUABLES.  Contacts, dentures or bridgework may not be worn into surgery.  Leave suitcase in the car. After surgery it may be brought to your room.     Patients discharged the day of surgery will not be allowed to drive home.  Name and phone number of your driver: SPOUSE OR DAUGHTER  Special Instructions: N/A              Please read over the following fact sheets you were given: _____________________________________________________________________             [facility] - Preparing for Surgery Before surgery, you can play an important role.  Because skin is not sterile, your skin needs to be as free of germs as possible.  You can reduce the number of germs on your skin by washing with CHG (chlorahexidine gluconate) soap before surgery.  CHG is an antiseptic cleaner which kills germs and bonds with the skin to continue killing germs even after washing. Please DO NOT use if you have an allergy to CHG or antibacterial soaps.  If your skin becomes reddened/irritated stop using the CHG and inform your nurse when  you arrive at Short Stay. Do not shave (including legs and underarms) for at least 48 hours prior to the first CHG shower.  You may shave your face/neck. Please follow these instructions carefully:  1.  Shower with CHG Soap the night before surgery and the  morning of Surgery.  2.  If you choose to wash your hair, wash your hair first as usual with your  normal  shampoo.  3.  After you shampoo, rinse your hair and body thoroughly to remove the  shampoo.                           4.  Use CHG as you would any other liquid soap.  You can apply chg directly  to the skin and wash  Gently with a scrungie or clean washcloth.  5.  Apply the CHG Soap to your body ONLY FROM THE NECK DOWN.   Do not use on face/ open                           Wound or open sores. Avoid contact with eyes, ears mouth and genitals (private parts).                       Wash face,  Genitals (private parts) with your normal soap.             6.  Wash thoroughly, paying special attention to the area where your surgery  will be performed.  7.  Thoroughly rinse your body with warm water from the neck down.  8.  DO NOT shower/wash with your normal soap after using and rinsing off  the CHG Soap.                9.  Pat yourself dry with a clean towel.            10.  Wear clean pajamas.            11.  Place clean sheets on your bed the night of your first shower and do not  sleep with pets. Day of Surgery : Do not apply any lotions/deodorants the morning of surgery.  Please wear clean clothes to the hospital/surgery center.  FAILURE TO FOLLOW THESE INSTRUCTIONS MAY RESULT IN THE CANCELLATION OF YOUR SURGERY PATIENT SIGNATURE_________________________________  NURSE SIGNATURE__________________________________  ________________________________________________________________________   Adam Phenix  An incentive spirometer is a tool that can help keep your lungs clear and active. This tool measures  how well you are filling your lungs with each breath. Taking long deep breaths may help reverse or decrease the chance of developing breathing (pulmonary) problems (especially infection) following:  A long period of time when you are unable to move or be active. BEFORE THE PROCEDURE   If the spirometer includes an indicator to show your best effort, your nurse or respiratory therapist will set it to a desired goal.  If possible, sit up straight or lean slightly forward. Try not to slouch.  Hold the incentive spirometer in an upright position. INSTRUCTIONS FOR USE  1. Sit on the edge of your bed if possible, or sit up as far as you can in bed or on a chair. 2. Hold the incentive spirometer in an upright position. 3. Breathe out normally. 4. Place the mouthpiece in your mouth and seal your lips tightly around it. 5. Breathe in slowly and as deeply as possible, raising the piston or the ball toward the top of the column. 6. Hold your breath for 3-5 seconds or for as long as possible. Allow the piston or ball to fall to the bottom of the column. 7. Remove the mouthpiece from your mouth and breathe out normally. 8. Rest for a few seconds and repeat Steps 1 through 7 at least 10 times every 1-2 hours when you are awake. Take your time and take a few normal breaths between deep breaths. 9. The spirometer may include an indicator to show your best effort. Use the indicator as a goal to work toward during each repetition. 10. After each set of 10 deep breaths, practice coughing to be sure your lungs are clear. If you have an incision (the cut made at the time of surgery),  support your incision when coughing by placing a pillow or rolled up towels firmly against it. Once you are able to get out of bed, walk around indoors and cough well. You may stop using the incentive spirometer when instructed by your caregiver.  RISKS AND COMPLICATIONS  Take your time so you do not get dizzy or light-headed.  If  you are in pain, you may need to take or ask for pain medication before doing incentive spirometry. It is harder to take a deep breath if you are having pain. AFTER USE  Rest and breathe slowly and easily.  It can be helpful to keep track of a log of your progress. Your caregiver can provide you with a simple table to help with this. If you are using the spirometer at home, follow these instructions: [facility] IF:   You are having difficultly using the spirometer.  You have trouble using the spirometer as often as instructed.  Your pain medication is not giving enough relief while using the spirometer.  You develop fever of 100.5 F (38.1 C) or higher. SEEK IMMEDIATE MEDICAL CARE IF:   You cough up bloody sputum that had not been present before.  You develop fever of 102 F (38.9 C) or greater.  You develop worsening pain at or near the incision site. MAKE SURE YOU:   Understand these instructions.  Will watch your condition.  Will get help right away if you are not doing well or get worse. Document Released: 06/10/2006 Document Revised: 04/22/2011 Document Reviewed: 08/11/2006 [facility] Patient Information 2014 [place], [place].   ________________________________________________________________________

## 2017-11-07 NOTE — Progress Notes (Signed)
ekg 05-03-17 eagle on chart lov note lori swanson np 09-02-17 on chart

## 2017-11-10 DIAGNOSIS — M25561 Pain in right knee: Secondary | ICD-10-CM | POA: Diagnosis not present

## 2017-11-11 ENCOUNTER — Encounter (HOSPITAL_COMMUNITY)
Admission: RE | Admit: 2017-11-11 | Discharge: 2017-11-11 | Disposition: A | Discharge status: Home / Self Care | Payer: 59 | Source: Ambulatory Visit | Attending: Specialist | Admitting: Specialist

## 2017-11-11 ENCOUNTER — Other Ambulatory Visit: Payer: Self-pay

## 2017-11-11 ENCOUNTER — Encounter (HOSPITAL_COMMUNITY): Payer: Self-pay

## 2017-11-11 DIAGNOSIS — M2242 Chondromalacia patellae, left knee: Secondary | ICD-10-CM | POA: Insufficient documentation

## 2017-11-11 DIAGNOSIS — Z01812 Encounter for preprocedural laboratory examination: Secondary | ICD-10-CM | POA: Insufficient documentation

## 2017-11-11 HISTORY — DX: Superior glenoid labrum lesion of unspecified shoulder, initial encounter: S43.439A

## 2017-11-11 HISTORY — DX: Personal history of urinary calculi: Z87.442

## 2017-11-11 HISTORY — DX: Chronic sinusitis, unspecified: J32.9

## 2017-11-11 LAB — CBC
HEMATOCRIT: 43.2 % (ref 36.0–46.0)
HEMOGLOBIN: 14.3 g/dL (ref 12.0–15.0)
MCH: 27.7 pg (ref 26.0–34.0)
MCHC: 33.1 g/dL (ref 30.0–36.0)
MCV: 83.7 fL (ref 78.0–100.0)
Platelets: 257 10*3/uL (ref 150–400)
RBC: 5.16 MIL/uL — AB (ref 3.87–5.11)
RDW: 13.4 % (ref 11.5–15.5)
WBC: 8.2 10*3/uL (ref 4.0–10.5)

## 2017-11-11 LAB — BASIC METABOLIC PANEL
ANION GAP: 9 (ref 5–15)
BUN: 15 mg/dL (ref 6–20)
CO2: 23 mmol/L (ref 22–32)
Calcium: 9.1 mg/dL (ref 8.9–10.3)
Chloride: 108 mmol/L (ref 98–111)
Creatinine, Ser: 0.67 mg/dL (ref 0.44–1.00)
Glucose, Bld: 108 mg/dL — ABNORMAL HIGH (ref 70–99)
POTASSIUM: 4.4 mmol/L (ref 3.5–5.1)
SODIUM: 140 mmol/L (ref 135–145)

## 2017-11-11 LAB — HCG, SERUM, QUALITATIVE: Preg, Serum: NEGATIVE

## 2017-11-13 DIAGNOSIS — M25561 Pain in right knee: Secondary | ICD-10-CM | POA: Diagnosis not present

## 2017-11-17 DIAGNOSIS — M25561 Pain in right knee: Secondary | ICD-10-CM | POA: Diagnosis not present

## 2017-11-18 NOTE — Anesthesia Preprocedure Evaluation (Addendum)
Anesthesia Evaluation  Patient identified by MRN, date of birth, ID band Patient awake    Reviewed: Allergy & Precautions, NPO status , Patient's Chart, lab work & pertinent test results  History of Anesthesia Complications Negative for: history of anesthetic complications  Airway Mallampati: I  TM Distance: >3 FB Neck ROM: Full    Dental  (+) Dental Advisory Given, Teeth Intact   Pulmonary neg pulmonary ROS,    breath sounds clear to auscultation       Cardiovascular hypertension ("white coat syndrome" - no meds),  Rhythm:Regular Rate:Normal     Neuro/Psych negative neurological ROS  negative psych ROS   GI/Hepatic Neg liver ROS, GERD  Controlled and Medicated,  Endo/Other   Obesity   Renal/GU negative Renal ROS  negative genitourinary   Musculoskeletal  (+) Arthritis , Fibromyalgia -  Abdominal (+) + obese,   Peds  Hematology negative hematology ROS (+)   Anesthesia Other Findings   Reproductive/Obstetrics                           Anesthesia Physical Anesthesia Plan  ASA: II  Anesthesia Plan: General   Post-op Pain Management:    Induction: Intravenous  PONV Risk Score and Plan: 3 and Treatment may vary due to age or medical condition, Ondansetron, Dexamethasone and Midazolam  Airway Management Planned: LMA  Additional Equipment: None  Intra-op Plan:   Post-operative Plan: Extubation in OR  Informed Consent: I have reviewed the patients History and Physical, chart, labs and discussed the procedure including the risks, benefits and alternatives for the proposed anesthesia with the patient or authorized representative who has indicated his/her understanding and acceptance.   Dental advisory given  Plan Discussed with: CRNA and Anesthesiologist  Anesthesia Plan Comments:        Anesthesia Quick Evaluation

## 2017-11-19 ENCOUNTER — Ambulatory Visit (HOSPITAL_COMMUNITY): Payer: 59 | Admitting: Anesthesiology

## 2017-11-19 ENCOUNTER — Encounter (HOSPITAL_COMMUNITY): Payer: Self-pay

## 2017-11-19 ENCOUNTER — Encounter (HOSPITAL_COMMUNITY)
Admission: RE | Disposition: A | Discharge status: Home / Self Care | Payer: Self-pay | Source: Other Acute Inpatient Hospital | Attending: Specialist

## 2017-11-19 ENCOUNTER — Ambulatory Visit (HOSPITAL_COMMUNITY)
Admission: RE | Admit: 2017-11-19 | Discharge: 2017-11-19 | Disposition: A | Discharge status: Home / Self Care | Payer: 59 | Source: Other Acute Inpatient Hospital | Attending: Specialist | Admitting: Specialist

## 2017-11-19 DIAGNOSIS — M797 Fibromyalgia: Secondary | ICD-10-CM | POA: Insufficient documentation

## 2017-11-19 DIAGNOSIS — Z6834 Body mass index (BMI) 34.0-34.9, adult: Secondary | ICD-10-CM | POA: Insufficient documentation

## 2017-11-19 DIAGNOSIS — Z881 Allergy status to other antibiotic agents status: Secondary | ICD-10-CM | POA: Diagnosis not present

## 2017-11-19 DIAGNOSIS — M1712 Unilateral primary osteoarthritis, left knee: Secondary | ICD-10-CM | POA: Diagnosis present

## 2017-11-19 DIAGNOSIS — E785 Hyperlipidemia, unspecified: Secondary | ICD-10-CM | POA: Insufficient documentation

## 2017-11-19 DIAGNOSIS — K219 Gastro-esophageal reflux disease without esophagitis: Secondary | ICD-10-CM | POA: Insufficient documentation

## 2017-11-19 DIAGNOSIS — E669 Obesity, unspecified: Secondary | ICD-10-CM | POA: Diagnosis not present

## 2017-11-19 DIAGNOSIS — I10 Essential (primary) hypertension: Secondary | ICD-10-CM | POA: Diagnosis not present

## 2017-11-19 DIAGNOSIS — G8929 Other chronic pain: Secondary | ICD-10-CM | POA: Insufficient documentation

## 2017-11-19 DIAGNOSIS — Z882 Allergy status to sulfonamides status: Secondary | ICD-10-CM | POA: Insufficient documentation

## 2017-11-19 DIAGNOSIS — M238X2 Other internal derangements of left knee: Secondary | ICD-10-CM | POA: Insufficient documentation

## 2017-11-19 DIAGNOSIS — Z885 Allergy status to narcotic agent status: Secondary | ICD-10-CM | POA: Insufficient documentation

## 2017-11-19 DIAGNOSIS — Z888 Allergy status to other drugs, medicaments and biological substances status: Secondary | ICD-10-CM | POA: Diagnosis not present

## 2017-11-19 DIAGNOSIS — M25562 Pain in left knee: Secondary | ICD-10-CM | POA: Diagnosis not present

## 2017-11-19 DIAGNOSIS — M2242 Chondromalacia patellae, left knee: Secondary | ICD-10-CM | POA: Insufficient documentation

## 2017-11-19 DIAGNOSIS — S8332XA Tear of articular cartilage of left knee, current, initial encounter: Secondary | ICD-10-CM | POA: Diagnosis not present

## 2017-11-19 HISTORY — PX: KNEE ARTHROSCOPY WITH LATERAL RELEASE: SHX5649

## 2017-11-19 SURGERY — ARTHROSCOPY, KNEE, WITH LATERAL RETINACULUM RELEASE
Anesthesia: General | Laterality: Left

## 2017-11-19 MED ORDER — ONDANSETRON HCL 4 MG/2ML IJ SOLN
INTRAMUSCULAR | Status: AC
  Filled 2017-11-19: qty 2

## 2017-11-19 MED ORDER — EPINEPHRINE PF 1 MG/ML IJ SOLN
INTRAMUSCULAR | Status: DC | PRN
  Administered 2017-11-19: 2 mg

## 2017-11-19 MED ORDER — ONDANSETRON HCL 4 MG/2ML IJ SOLN
INTRAMUSCULAR | Status: DC | PRN
  Administered 2017-11-19: 4 mg via INTRAVENOUS

## 2017-11-19 MED ORDER — LIDOCAINE HCL (CARDIAC) PF 100 MG/5ML IV SOSY
PREFILLED_SYRINGE | INTRAVENOUS | Status: AC
  Filled 2017-11-19: qty 5

## 2017-11-19 MED ORDER — PROMETHAZINE HCL 25 MG/ML IJ SOLN
INTRAMUSCULAR | Status: AC
  Filled 2017-11-19: qty 1

## 2017-11-19 MED ORDER — BUPIVACAINE-EPINEPHRINE 0.5% -1:200000 IJ SOLN
INTRAMUSCULAR | Status: DC | PRN
  Administered 2017-11-19: 20 mL

## 2017-11-19 MED ORDER — MIDAZOLAM HCL 5 MG/5ML IJ SOLN
INTRAMUSCULAR | Status: DC | PRN
  Administered 2017-11-19: 2 mg via INTRAVENOUS

## 2017-11-19 MED ORDER — CLINDAMYCIN PHOSPHATE 900 MG/50ML IV SOLN
INTRAVENOUS | Status: AC
  Filled 2017-11-19: qty 50

## 2017-11-19 MED ORDER — LIDOCAINE 2% (20 MG/ML) 5 ML SYRINGE
INTRAMUSCULAR | Status: DC | PRN
  Administered 2017-11-19: 100 mg via INTRAVENOUS

## 2017-11-19 MED ORDER — CLINDAMYCIN PHOSPHATE 900 MG/50ML IV SOLN
900.0000 mg | INTRAVENOUS | Status: AC
  Administered 2017-11-19: 900 mg via INTRAVENOUS

## 2017-11-19 MED ORDER — EPHEDRINE SULFATE-NACL 50-0.9 MG/10ML-% IV SOSY
PREFILLED_SYRINGE | INTRAVENOUS | Status: DC | PRN
  Administered 2017-11-19: 5 mg via INTRAVENOUS

## 2017-11-19 MED ORDER — OXYCODONE HCL 5 MG PO TABS: 5.0000 mg | ORAL_TABLET | Freq: Four times a day (QID) | ORAL | 0 refills | Status: AC | PRN

## 2017-11-19 MED ORDER — FENTANYL CITRATE (PF) 100 MCG/2ML IJ SOLN: 25.0000 ug | INTRAMUSCULAR | Status: DC | PRN

## 2017-11-19 MED ORDER — PROPOFOL 10 MG/ML IV BOLUS
INTRAVENOUS | Status: AC
  Filled 2017-11-19: qty 20

## 2017-11-19 MED ORDER — EPHEDRINE 5 MG/ML INJ
INTRAVENOUS | Status: AC
  Filled 2017-11-19: qty 10

## 2017-11-19 MED ORDER — LACTATED RINGERS IV SOLN
INTRAVENOUS | Status: DC
  Administered 2017-11-19: 1000 mL via INTRAVENOUS

## 2017-11-19 MED ORDER — PROPOFOL 10 MG/ML IV BOLUS
INTRAVENOUS | Status: DC | PRN
  Administered 2017-11-19: 200 mg via INTRAVENOUS

## 2017-11-19 MED ORDER — OXYCODONE HCL 5 MG PO TABS
5.0000 mg | ORAL_TABLET | Freq: Once | ORAL | Status: AC | PRN
  Administered 2017-11-19: 5 mg via ORAL

## 2017-11-19 MED ORDER — BUPIVACAINE-EPINEPHRINE 0.5% -1:200000 IJ SOLN
INTRAMUSCULAR | Status: AC
  Filled 2017-11-19: qty 1

## 2017-11-19 MED ORDER — DEXAMETHASONE SODIUM PHOSPHATE 10 MG/ML IJ SOLN
INTRAMUSCULAR | Status: AC
  Filled 2017-11-19: qty 1

## 2017-11-19 MED ORDER — LACTATED RINGERS IR SOLN
Status: DC | PRN
  Administered 2017-11-19 (×2): 6000 mL

## 2017-11-19 MED ORDER — PROMETHAZINE HCL 25 MG/ML IJ SOLN
6.2500 mg | INTRAMUSCULAR | Status: DC | PRN
  Administered 2017-11-19: 6.25 mg via INTRAVENOUS

## 2017-11-19 MED ORDER — EPINEPHRINE PF 1 MG/ML IJ SOLN
INTRAMUSCULAR | Status: AC
  Filled 2017-11-19: qty 1

## 2017-11-19 MED ORDER — CHLORHEXIDINE GLUCONATE 4 % EX LIQD: 60.0000 mL | Freq: Once | CUTANEOUS | Status: DC

## 2017-11-19 MED ORDER — MIDAZOLAM HCL 2 MG/2ML IJ SOLN
INTRAMUSCULAR | Status: AC
  Filled 2017-11-19: qty 2

## 2017-11-19 MED ORDER — EPINEPHRINE 30 MG/30ML IJ SOLN
INTRAMUSCULAR | Status: AC
  Filled 2017-11-19: qty 1

## 2017-11-19 MED ORDER — FENTANYL CITRATE (PF) 100 MCG/2ML IJ SOLN
INTRAMUSCULAR | Status: DC | PRN
  Administered 2017-11-19 (×2): 50 ug via INTRAVENOUS

## 2017-11-19 MED ORDER — FENTANYL CITRATE (PF) 100 MCG/2ML IJ SOLN
INTRAMUSCULAR | Status: AC
  Filled 2017-11-19: qty 2

## 2017-11-19 MED ORDER — OXYCODONE HCL 5 MG/5ML PO SOLN
5.0000 mg | Freq: Once | ORAL | Status: AC | PRN
  Filled 2017-11-19: qty 5

## 2017-11-19 MED ORDER — OXYCODONE HCL 5 MG PO TABS
ORAL_TABLET | ORAL | Status: AC
  Administered 2017-11-19: 5 mg via ORAL
  Filled 2017-11-19: qty 1

## 2017-11-19 SURGICAL SUPPLY — 30 items
BANDAGE ACE 6X5 VEL STRL LF (GAUZE/BANDAGES/DRESSINGS) ×2 IMPLANT
BLADE 4.2CUDA (BLADE) ×2 IMPLANT
BLADE CUDA SHAVER 3.5 (BLADE) IMPLANT
BLADE SURG SZ11 CARB STEEL (BLADE) IMPLANT
BOOTIES KNEE HIGH SLOAN (MISCELLANEOUS) ×4 IMPLANT
CLOTH 2% CHLOROHEXIDINE 3PK (PERSONAL CARE ITEMS) ×2 IMPLANT
COVER SURGICAL LIGHT HANDLE (MISCELLANEOUS) ×2 IMPLANT
COVER WAND RF STERILE (DRAPES) IMPLANT
DRSG EMULSION OIL 3X3 NADH (GAUZE/BANDAGES/DRESSINGS) ×2 IMPLANT
DRSG PAD ABDOMINAL 8X10 ST (GAUZE/BANDAGES/DRESSINGS) ×2 IMPLANT
DURAPREP 26ML APPLICATOR (WOUND CARE) ×2 IMPLANT
GLOVE BIOGEL PI IND STRL 7.5 (GLOVE) ×1 IMPLANT
GLOVE BIOGEL PI IND STRL 8 (GLOVE) ×1 IMPLANT
GLOVE BIOGEL PI INDICATOR 7.5 (GLOVE) ×1
GLOVE BIOGEL PI INDICATOR 8 (GLOVE) ×1
GLOVE SURG SS PI 7.5 STRL IVOR (GLOVE) ×12 IMPLANT
GLOVE SURG SS PI 8.0 STRL IVOR (GLOVE) ×4 IMPLANT
GOWN STRL REUS W/TWL LRG LVL3 (GOWN DISPOSABLE) ×2 IMPLANT
GOWN STRL REUS W/TWL XL LVL3 (GOWN DISPOSABLE) ×4 IMPLANT
KIT BASIN OR (CUSTOM PROCEDURE TRAY) ×2 IMPLANT
MANIFOLD NEPTUNE II (INSTRUMENTS) ×2 IMPLANT
PACK ARTHROSCOPY WL (CUSTOM PROCEDURE TRAY) ×2 IMPLANT
PADDING CAST COTTON 6X4 STRL (CAST SUPPLIES) ×2 IMPLANT
SHAVER 4.2 MM LANZA 9391A (BLADE) ×2 IMPLANT
SUT ETHILON 4 0 PS 2 18 (SUTURE) ×2 IMPLANT
TOWEL OR 17X26 10 PK STRL BLUE (TOWEL DISPOSABLE) ×2 IMPLANT
TUBING ARTHRO INFLOW-ONLY STRL (TUBING) ×2 IMPLANT
WAND HAND CNTRL MULTIVAC 50 (MISCELLANEOUS) ×2 IMPLANT
WAND HOOK EDGE (SURGICAL WAND) IMPLANT
WRAP KNEE MAXI GEL POST OP (GAUZE/BANDAGES/DRESSINGS) ×2 IMPLANT

## 2017-11-19 NOTE — Brief Op Note (Signed)
11/19/2017  9:27 AM  PATIENT:  Christine Davenport  42 y.o. female  PRE-OPERATIVE DIAGNOSIS:  Left patella femoral chondromalacia  POST-OPERATIVE DIAGNOSIS:  Left patella femoral chondromalacia  PROCEDURE:  Procedure(s) with comments: Left knee arthroscopy and debridement (Left) - 60 mins  SURGEON:  Surgeon(s) and Role:    Jene Every, MD - Primary  PHYSICIAN ASSISTANT:   ASSISTANTS: [place]   ANESTHESIA:   general  EBL:  min   BLOOD ADMINISTERED:none  DRAINS: none   LOCAL MEDICATIONS USED:  MARCAINE     SPECIMEN:  No Specimen  DISPOSITION OF SPECIMEN:  N/A  COUNTS:  YES  TOURNIQUET:  * No tourniquets in log *  DICTATION: .Other Dictation: Dictation Number [id number]  PLAN OF CARE: Discharge to home after PACU  PATIENT DISPOSITION:  PACU - hemodynamically stable.   Delay start of Pharmacological VTE agent (>24hrs) due to surgical blood loss or risk of bleeding: yes

## 2017-11-19 NOTE — Anesthesia Procedure Notes (Signed)
Procedure Name: LMA Insertion Date/Time: 11/19/2017 8:52 AM Performed by: Orest Dikes, CRNA Pre-anesthesia Checklist: Patient identified, Emergency Drugs available, Suction available and Patient being monitored Patient Re-evaluated:Patient Re-evaluated prior to induction Oxygen Delivery Method: Circle system utilized Preoxygenation: Pre-oxygenation with 100% oxygen Induction Type: IV induction LMA: LMA inserted LMA Size: 4.0 Number of attempts: 1 Placement Confirmation: positive ETCO2 and breath sounds checked- equal and bilateral Tube secured with: Tape Dental Injury: Teeth and Oropharynx as per pre-operative assessment

## 2017-11-19 NOTE — Interval H&P Note (Signed)
History and Physical Interval Note:  11/19/2017 8:31 AM  Christine Davenport  has presented today for surgery, with the diagnosis of Left patella femoral chondromalacia  The various methods of treatment have been discussed with the patient and family. After consideration of risks, benefits and other options for treatment, the patient has consented to  Procedure(s) with comments: Left knee arthroscopy, chondroplasty, possible lateral release, debridement (Left) - 60 mins as a surgical intervention .  The patient's history has been reviewed, patient examined, no change in status, stable for surgery.  I have reviewed the patient's chart and labs.  Questions were answered to the patient's satisfaction.     Hicks Feick C

## 2017-11-19 NOTE — Transfer of Care (Signed)
Immediate Anesthesia Transfer of Care Note  Patient: Christine Davenport  Procedure(s) Performed: Left knee arthroscopy and debridement (Left )  Patient Location: PACU  Anesthesia Type:General  Level of Consciousness: awake, alert  and oriented  Airway & Oxygen Therapy: Patient Spontanous Breathing and Patient connected to face mask oxygen  Post-op Assessment: Report given to RN and Post -op Vital signs reviewed and stable  Post vital signs: Reviewed and stable  Last Vitals:  Vitals Value Taken Time  BP 145/94 11/19/2017  9:45 AM  Temp    Pulse 85 11/19/2017  9:45 AM  Resp 15 11/19/2017  9:45 AM  SpO2 100 % 11/19/2017  9:45 AM  Vitals shown include unvalidated device data.  Last Pain:  Vitals:   11/19/17 0725  TempSrc:   PainSc: 2       Patients Stated Pain Goal: 4 (11/19/17 0725)  Complications: No apparent anesthesia complications

## 2017-11-19 NOTE — Discharge Instructions (Signed)
°  3ARTHROSCOPIC KNEE SURGERY HOME CARE INSTRUCTIONS   PAIN You will be expected to have a moderate amount of pain in the affected knee for approximately two weeks.  However, the first two to four days will be the most severe in terms of the pain you will experience.  Prescriptions have been provided for you to take as needed for the pain.  The pain can be markedly reduced by using the ice/compressive bandage given.  Exchange the ice packs whenever they thaw.  During the night, keep the bandage on because it will still provide some compression for the swelling.  Also, keep the leg elevated on pillows above your heart, and this will help alleviate the pain and swelling.  MEDICATION Prescriptions have been provided to take as needed for pain. To prevent blood clots, take Aspirin 325mg  daily with a meal if not on a blood thinner and if no history of stomach ulcers.  ACTIVITY House ambulation for first 24 hours . After this, you can start to be up and about progressively more.  Remember that the swelling may still increase after three to four days if you are up and doing too much.  You may put as much weight on the affected leg as pain will allow.  Use your crutches for comfort and safety.  However, as soon as you are able, you may discard the crutches and go without them.   DRESSING Keep the current dressing as dry as possible.  Two days after your surgery, you may remove the ice/compressive wrap, and surgical dressing.  You may now take a shower, but do not scrub the sounds directly with soap.  Let water rinse over these and gently wipe with your hand.  Reapply band-aids over the puncture wounds and more gauze if needed.  A slight amount of thin drainage can be normal at this time, and do not let it frighten you.  Reapply the ice/compressive wrap.  You may now repeat this every day each time you shower.  SYMPTOMS TO REPORT TO YOUR DOCTOR  -Extreme pain.  -Extreme swelling.  -Temperature above 101  degrees that does not come down with acetaminophen     (Tylenol).  -Any changes in the feeling, color or movement of your toes.  -Extreme redness, heat, swelling or drainage at your incision  EXERCISE It is preferred that you begin to exercise on the day of your surgery.  Straight leg raises and short arc quads should be begun the afternoon or evening of surgery and continued until you come back for your follow-up appointment.   Attached is an instruction sheet on how to perform these two simple exercises.  Do these at least three times per day if not more.  You may bend your knee as much as is comfortable.  The puncture wounds may occasionally be slightly uncomfortable with bending of the knee.  Do not let this frighten you.  It is important to keep your knee motion, but do not overdo it.  If you have significant pain, simply do not bend the knee as far.   You will be given more exercises to perform at your first return visit.    RETURN APPOINTMENT Please make an appointment to be seen by your doctor in 10-14 days from your surgery.  Patient Signature:  ________________________________________________________  Nurse's Signature:  ________________________________________________________

## 2017-11-19 NOTE — Op Note (Signed)
Christine Davenport, Christine Davenport MEDICAL RECORD ZO:[id number] ACCOUNT [id number] DATE OF BIRTH:1975/05/18 FACILITY: [facility] LOCATION: [facility] PHYSICIAN:Latania Bascomb Connye Burkitt, MD  OPERATIVE REPORT  DATE OF PROCEDURE:  11/19/2017  PREOPERATIVE DIAGNOSES:  Chondral flap tear medial femoral condyle, severe patellofemoral arthrosis.  POSTOPERATIVE DIAGNOSES:   Chondral flap tear medial femoral condyle, severe patellofemoral arthrosis.  PROCEDURE PERFORMED: 1.  Left knee arthroscopy. 2.  Chondroplasty medial femoral condyle. 3.  Chondroplasty patellofemoral joint.  ANESTHESIA:  General.  ASSISTANT:  Andrez Grime, PA  HISTORY:  A 42 year old pleasant female with persistent knee pain, mainly patellofemoral and the medial joint line.  Fracture risk.  Activity modification, injections.  She also has hyperlaxity syndrome and fibromyalgia.  She did have temporary relief  from a corticosteroid injection as well.  Failing conservative treatment, we discussed with mechanical symptoms a knee arthroscopy, evaluation, chondroplasty, and removal of loose bodies as noted.  Risks and benefits discussed including bleeding,  infection, damage to neurovascular structures, no change in symptoms, worse symptoms, DVT, PE, anesthetic complications, etc.  TECHNIQUE:  The patient in supine position after induction of adequate general anesthesia and clindamycin.  The left lower extremity was prepped and draped in the usual sterile fashion.  A lateral parapatellar portal was fashioned with a #11 blade.   Ingress cannula atraumatically placed.  Irrigant was utilized to insufflate the joint 65 mmHg.  Under direct visualization, a medial parapatellar portal was fashioned with a #11 blade after localization with an 18-gauge needle, sparing the medial  meniscus.  Noted was a large chondral flap tear off the medial femoral condyle, extending into the sulcus.  The meniscus was unremarkable without evidence of tear to probe palpation.   The tibial plateau was unremarkable, as was the remainder of the  femoral condyle.  Introduced a chondral shaver and shaved this area of chondral flap tear.  It was only approximately a millimeter in depth.  Next, suprapatellar pouch revealed extensive grade II changes of the entire patella.  There was normal  patellofemoral tracking.  Mild changes of the sulcus extending into the medial femoral condyle.  Chondroplasty was performed of the entire patella lightly as the medial sulcus.  There was normal patellofemoral tracking.  Gutters were unremarkable.  She  did hyperextend preoperatively with her examination.  She had no PCL injury.  Examination of the lateral compartment revealed normal femoral condyle, tibial plateau, and meniscus stable to probe palpation without evidence of tearing.  Next, I revisited all compartments.  No further pathology amenable to arthroscopic intervention.  I therefore removed all instrumentation.  Portals were closed with 4-0 nylon simple sutures.  Marcaine 0.25% with epinephrine was infiltrated in the joint.   Wound was dressed sterilely.  Awoken without difficulty and transported to the recovery room in satisfactory condition.  The patient tolerated the procedure well.  No complications.  Assistant Andrez Grime, [place].  Minimal blood loss.  LN/NUANCE  D:11/19/2017 T:11/19/2017 JOB:[id number]

## 2017-11-20 ENCOUNTER — Encounter (HOSPITAL_COMMUNITY): Payer: Self-pay | Admitting: Specialist

## 2017-11-20 NOTE — Anesthesia Postprocedure Evaluation (Signed)
Anesthesia Post Note  Patient: Christine Davenport  Procedure(s) Performed: Left knee arthroscopy and debridement (Left )     Patient location during evaluation: PACU Anesthesia Type: General Level of consciousness: awake and alert Pain management: pain level controlled Vital Signs Assessment: post-procedure vital signs reviewed and stable Respiratory status: spontaneous breathing, nonlabored ventilation and respiratory function stable Cardiovascular status: blood pressure returned to baseline and stable Postop Assessment: no apparent nausea or vomiting Anesthetic complications: no    Last Vitals:  Vitals:   11/19/17 1045 11/19/17 1128  BP: 136/79 (!) 147/98  Pulse: 64 71  Resp: 17 18  Temp: 36.7 C (!) 36.3 C  SpO2: 97% 100%    Last Pain:  Vitals:   11/19/17 1128  TempSrc:   PainSc: 5                  Beryle Lathe

## 2017-12-30 DIAGNOSIS — M170 Bilateral primary osteoarthritis of knee: Secondary | ICD-10-CM | POA: Diagnosis not present

## 2018-03-04 DIAGNOSIS — S43432D Superior glenoid labrum lesion of left shoulder, subsequent encounter: Secondary | ICD-10-CM | POA: Diagnosis not present

## 2018-03-17 DIAGNOSIS — M1711 Unilateral primary osteoarthritis, right knee: Secondary | ICD-10-CM | POA: Diagnosis not present

## 2018-03-24 DIAGNOSIS — M1711 Unilateral primary osteoarthritis, right knee: Secondary | ICD-10-CM | POA: Diagnosis not present

## 2018-03-31 DIAGNOSIS — M1711 Unilateral primary osteoarthritis, right knee: Secondary | ICD-10-CM | POA: Diagnosis not present

## 2018-05-18 DIAGNOSIS — Q828 Other specified congenital malformations of skin: Secondary | ICD-10-CM | POA: Diagnosis not present

## 2018-05-18 DIAGNOSIS — M25561 Pain in right knee: Secondary | ICD-10-CM | POA: Diagnosis not present

## 2018-05-18 DIAGNOSIS — M25562 Pain in left knee: Secondary | ICD-10-CM | POA: Diagnosis not present

## 2018-05-27 DIAGNOSIS — M25561 Pain in right knee: Secondary | ICD-10-CM | POA: Diagnosis not present

## 2018-06-04 DIAGNOSIS — M25561 Pain in right knee: Secondary | ICD-10-CM | POA: Diagnosis not present

## 2018-06-04 DIAGNOSIS — Q828 Other specified congenital malformations of skin: Secondary | ICD-10-CM | POA: Diagnosis not present

## 2018-06-04 DIAGNOSIS — M797 Fibromyalgia: Secondary | ICD-10-CM | POA: Diagnosis not present

## 2020-09-18 ENCOUNTER — Other Ambulatory Visit: Payer: Self-pay | Admitting: Obstetrics & Gynecology

## 2020-09-18 DIAGNOSIS — R928 Other abnormal and inconclusive findings on diagnostic imaging of breast: Secondary | ICD-10-CM

## 2020-09-21 ENCOUNTER — Other Ambulatory Visit: Payer: Self-pay | Admitting: Obstetrics & Gynecology

## 2020-09-21 DIAGNOSIS — R928 Other abnormal and inconclusive findings on diagnostic imaging of breast: Secondary | ICD-10-CM

## 2020-09-22 ENCOUNTER — Ambulatory Visit
Admission: RE | Admit: 2020-09-22 | Discharge: 2020-09-22 | Disposition: A | Discharge status: Home / Self Care | Payer: 59 | Source: Ambulatory Visit | Attending: Obstetrics & Gynecology | Admitting: Obstetrics & Gynecology

## 2020-09-22 ENCOUNTER — Other Ambulatory Visit: Payer: Self-pay | Admitting: Obstetrics & Gynecology

## 2020-09-22 ENCOUNTER — Other Ambulatory Visit: Payer: Self-pay

## 2020-09-22 DIAGNOSIS — R928 Other abnormal and inconclusive findings on diagnostic imaging of breast: Secondary | ICD-10-CM

## 2020-09-22 DIAGNOSIS — R921 Mammographic calcification found on diagnostic imaging of breast: Secondary | ICD-10-CM

## 2020-10-04 ENCOUNTER — Other Ambulatory Visit: Payer: Self-pay

## 2020-10-04 ENCOUNTER — Ambulatory Visit
Admission: RE | Admit: 2020-10-04 | Discharge: 2020-10-04 | Disposition: A | Discharge status: Home / Self Care | Payer: 59 | Source: Ambulatory Visit | Attending: Obstetrics & Gynecology | Admitting: Obstetrics & Gynecology

## 2020-10-04 ENCOUNTER — Other Ambulatory Visit: Payer: Self-pay | Admitting: Obstetrics & Gynecology

## 2020-10-04 DIAGNOSIS — R921 Mammographic calcification found on diagnostic imaging of breast: Secondary | ICD-10-CM

## 2021-06-19 ENCOUNTER — Other Ambulatory Visit: Payer: Self-pay | Admitting: Obstetrics & Gynecology

## 2021-06-19 DIAGNOSIS — Z09 Encounter for follow-up examination after completed treatment for conditions other than malignant neoplasm: Secondary | ICD-10-CM

## 2021-06-23 ENCOUNTER — Ambulatory Visit
Admission: RE | Admit: 2021-06-23 | Discharge: 2021-06-23 | Disposition: A | Discharge status: Home / Self Care | Payer: 59 | Source: Ambulatory Visit | Attending: Obstetrics & Gynecology | Admitting: Obstetrics & Gynecology

## 2021-06-23 ENCOUNTER — Other Ambulatory Visit: Payer: Self-pay | Admitting: Obstetrics & Gynecology

## 2021-06-23 DIAGNOSIS — Z09 Encounter for follow-up examination after completed treatment for conditions other than malignant neoplasm: Secondary | ICD-10-CM

## 2021-10-09 ENCOUNTER — Ambulatory Visit
Admission: RE | Admit: 2021-10-09 | Discharge: 2021-10-09 | Disposition: A | Discharge status: Home / Self Care | Payer: 59 | Source: Ambulatory Visit | Attending: Obstetrics & Gynecology | Admitting: Obstetrics & Gynecology

## 2021-10-09 DIAGNOSIS — Z09 Encounter for follow-up examination after completed treatment for conditions other than malignant neoplasm: Secondary | ICD-10-CM

## 2022-09-04 ENCOUNTER — Other Ambulatory Visit: Payer: Self-pay | Admitting: Obstetrics and Gynecology

## 2022-09-04 ENCOUNTER — Encounter: Payer: Self-pay | Admitting: Obstetrics and Gynecology

## 2022-09-04 DIAGNOSIS — R921 Mammographic calcification found on diagnostic imaging of breast: Secondary | ICD-10-CM

## 2022-10-11 ENCOUNTER — Ambulatory Visit
Admission: RE | Admit: 2022-10-11 | Discharge: 2022-10-11 | Disposition: A | Discharge status: Home / Self Care | Payer: 59 | Source: Ambulatory Visit | Attending: Obstetrics and Gynecology | Admitting: Obstetrics and Gynecology

## 2022-10-11 DIAGNOSIS — R921 Mammographic calcification found on diagnostic imaging of breast: Secondary | ICD-10-CM

## 2023-01-11 IMAGING — MG MM DIGITAL DIAGNOSTIC UNILAT*R* W/ TOMO W/ CAD
8 series · 8 of 16 positions shown · non-contrast
Comparison: Previous exam(s).

CLINICAL DATA: 46-year-old female presenting for follow-up of right
breast calcifications. Two sites within this broad area of
calcifications were biopsied demonstrating fibrocystic changes.

EXAM:
DIGITAL DIAGNOSTIC UNILATERAL RIGHT MAMMOGRAM WITH TOMOSYNTHESIS AND
CAD
TECHNIQUE: Right digital diagnostic mammography and breast tomosynthesis was
performed. The images were evaluated with computer-aided detection.

[R CC (1 of 2)]
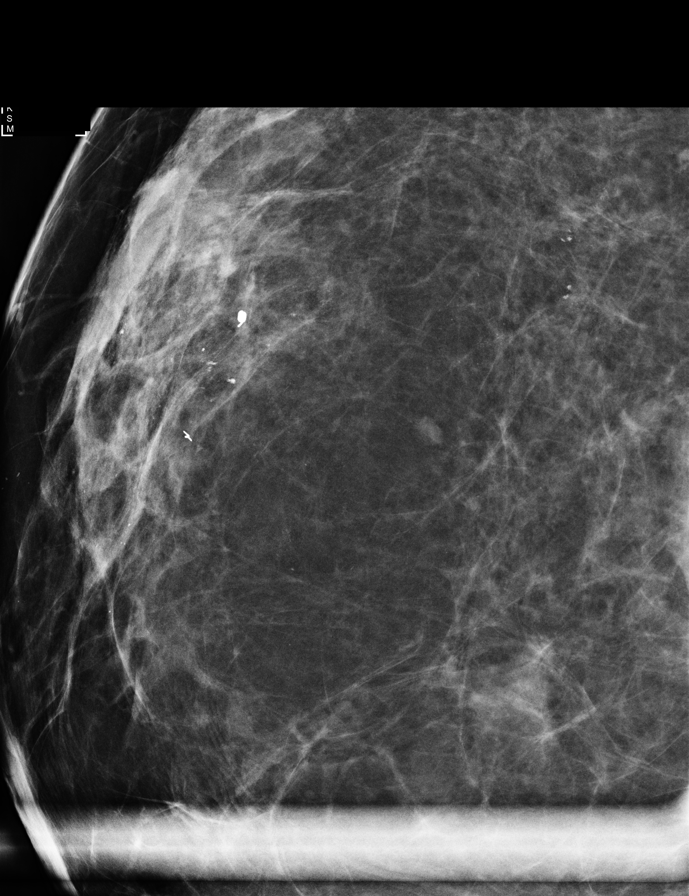

[R CC (2 of 2)]
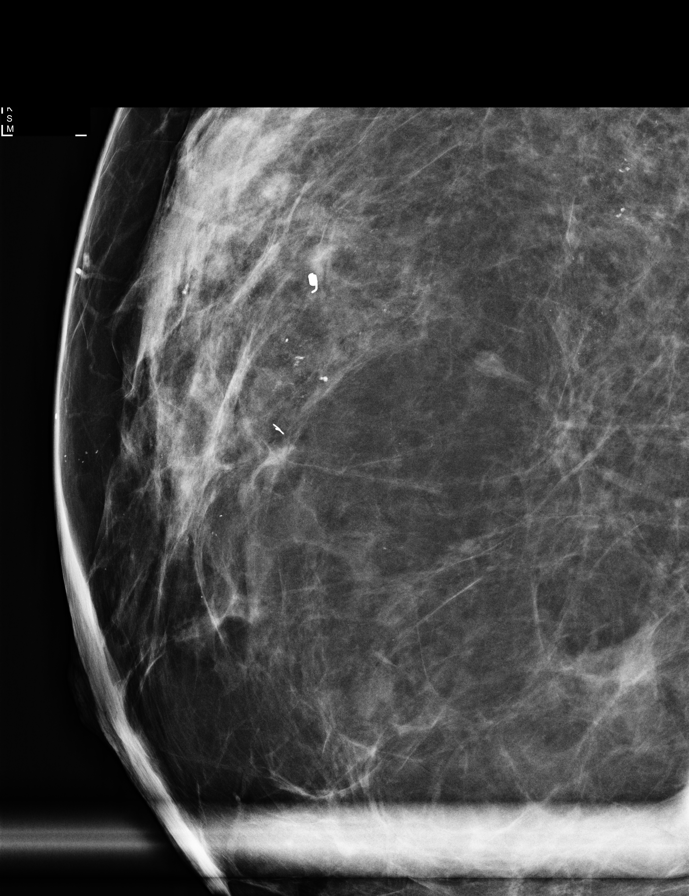

[R ML (1 of 2)]
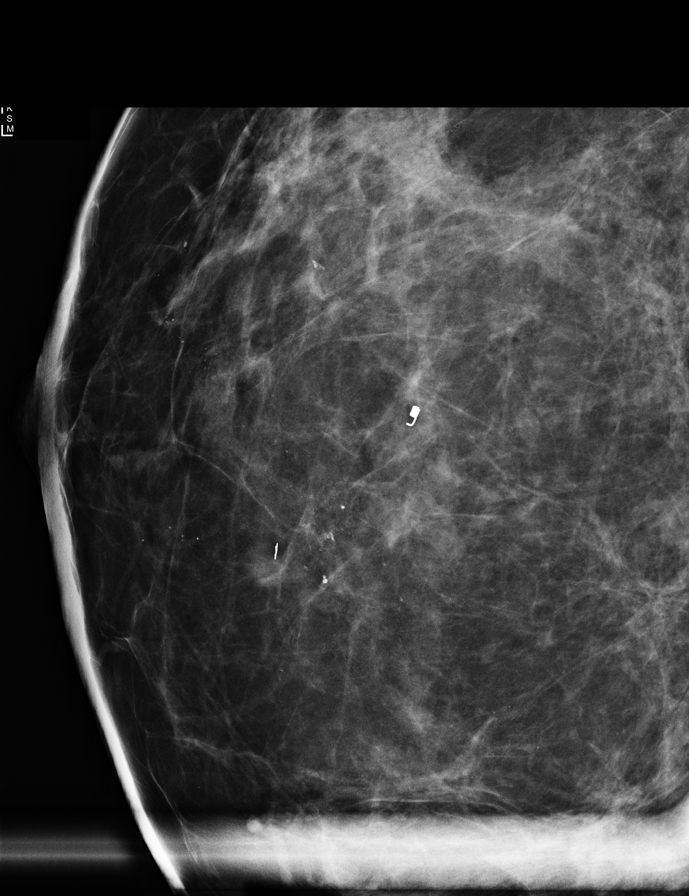

[R ML (2 of 2)]
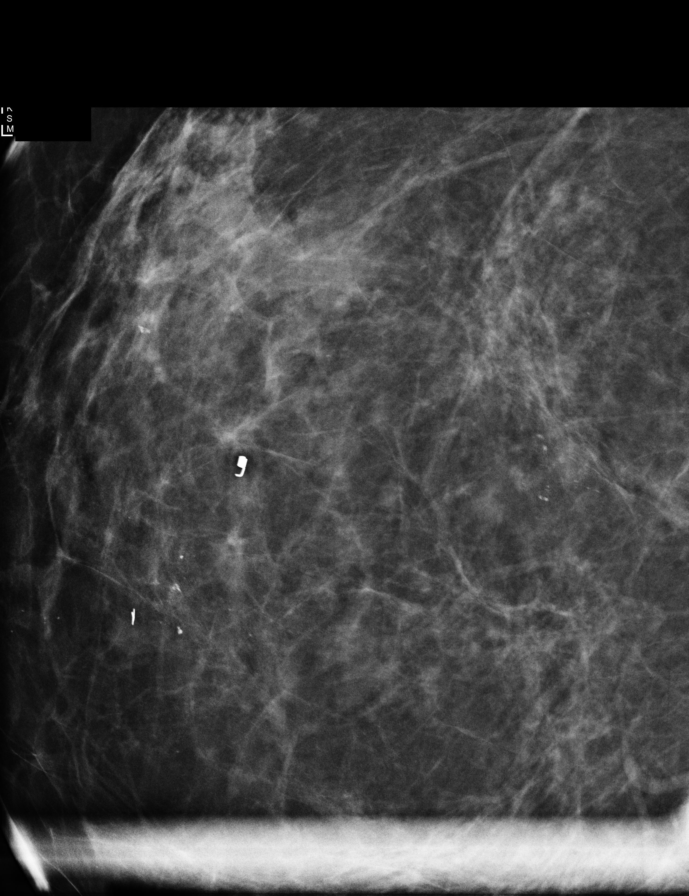

[R MLO synth-2D]
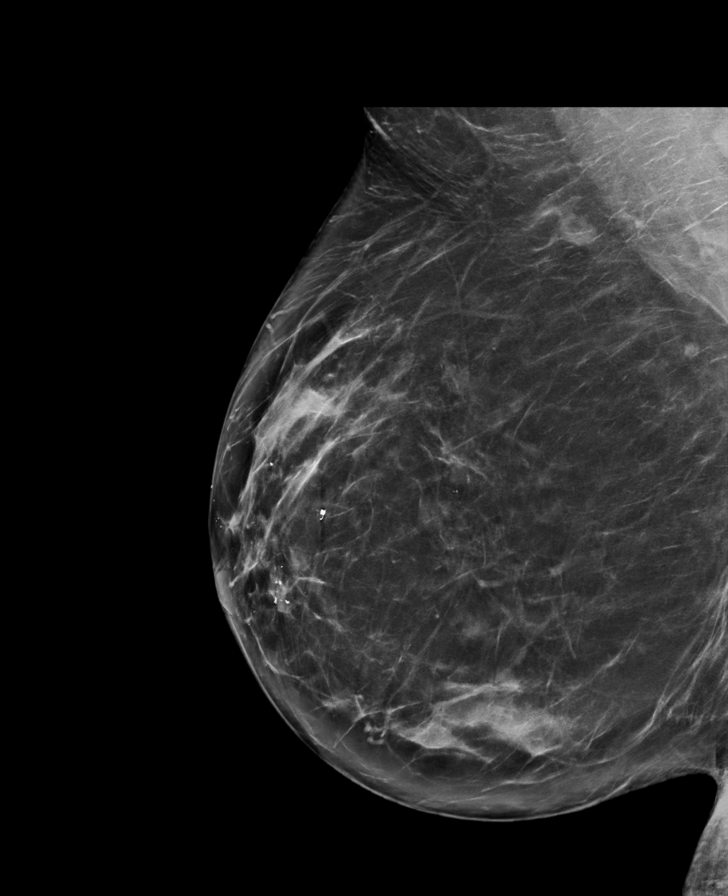

[R CC synth-2D]
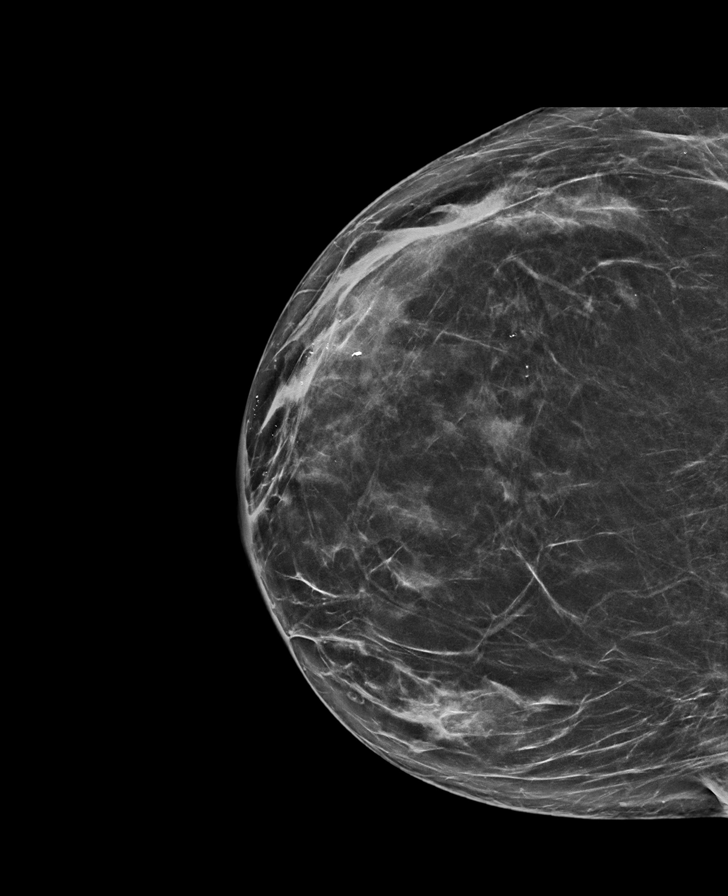

[R CC tomo · tomo slice 40/79.0]
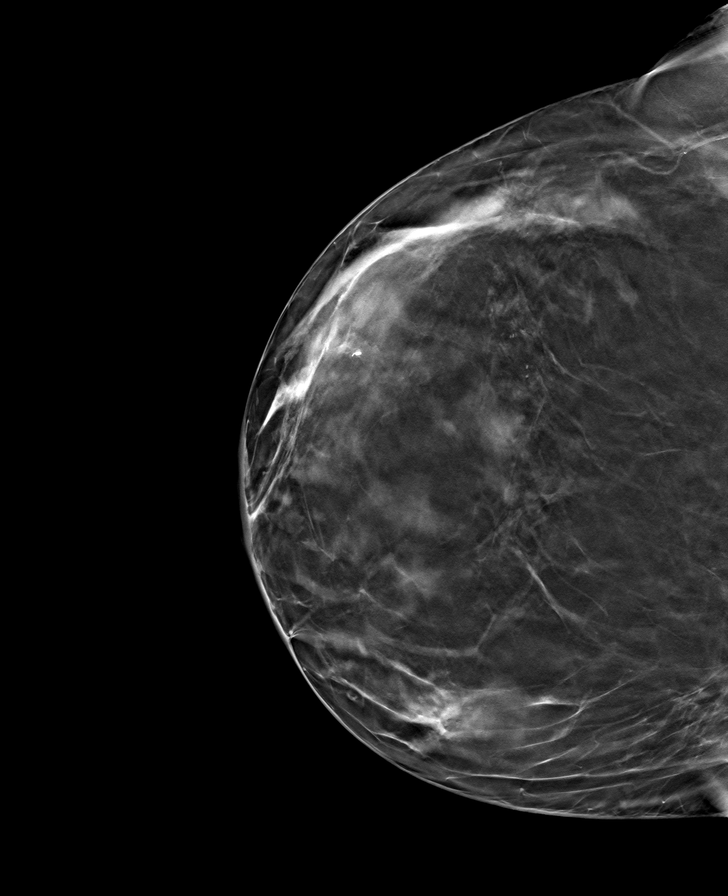

[R MLO tomo · tomo slice 46/91.0]
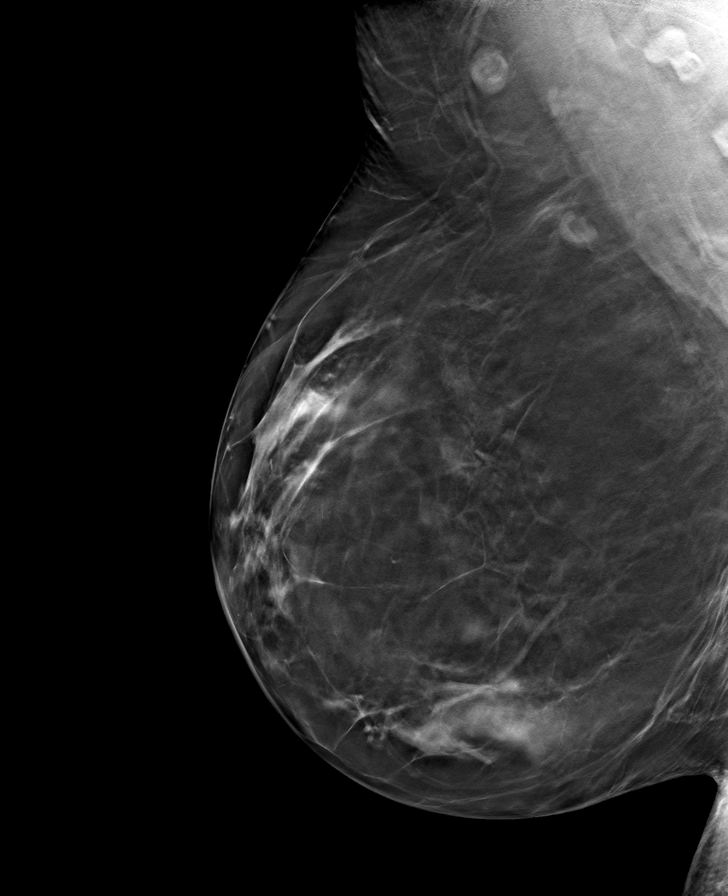

[8 of 16 positions shown; findings below may reference images not displayed]

ACR Breast Density Category b: There are scattered areas of
fibroglandular density.
FINDINGS: There are no suspicious changes at the 2 biopsy sites in the upper
outer anterior right breast. The areas of non biopsied
calcifications in this region are mammographically stable. No new
suspicious calcifications, masses or areas of distortion are seen in
the right breast.
IMPRESSION: Stable likely benign right breast calcifications.

RECOMMENDATION:
Bilateral diagnostic mammography is recommended in Saturday September, 2021.

I have discussed the findings and recommendations with the patient.
If applicable, a reminder letter will be sent to the patient
regarding the next appointment.

BI-RADS CATEGORY  3: Probably benign.

## 2023-10-21 ENCOUNTER — Encounter (HOSPITAL_BASED_OUTPATIENT_CLINIC_OR_DEPARTMENT_OTHER): Payer: Self-pay | Admitting: Emergency Medicine

## 2023-10-21 ENCOUNTER — Emergency Department (HOSPITAL_BASED_OUTPATIENT_CLINIC_OR_DEPARTMENT_OTHER)
Admission: EM | Admit: 2023-10-21 | Discharge: 2023-10-21 | Disposition: A | Discharge status: Home / Self Care | Attending: Emergency Medicine | Admitting: Emergency Medicine

## 2023-10-21 ENCOUNTER — Other Ambulatory Visit: Payer: Self-pay

## 2023-10-21 ENCOUNTER — Emergency Department (HOSPITAL_BASED_OUTPATIENT_CLINIC_OR_DEPARTMENT_OTHER)

## 2023-10-21 DIAGNOSIS — R1031 Right lower quadrant pain: Secondary | ICD-10-CM | POA: Insufficient documentation

## 2023-10-21 DIAGNOSIS — R109 Unspecified abdominal pain: Secondary | ICD-10-CM | POA: Diagnosis present

## 2023-10-21 DIAGNOSIS — N200 Calculus of kidney: Secondary | ICD-10-CM

## 2023-10-21 LAB — CBC WITH DIFFERENTIAL/PLATELET
Abs Immature Granulocytes: 0.03 K/uL (ref 0.00–0.07)
Basophils Absolute: 0.1 K/uL (ref 0.0–0.1)
Basophils Relative: 1 %
Eosinophils Absolute: 0.1 K/uL (ref 0.0–0.5)
Eosinophils Relative: 1 %
HCT: 46.9 % — ABNORMAL HIGH (ref 36.0–46.0)
Hemoglobin: 16.3 g/dL — ABNORMAL HIGH (ref 12.0–15.0)
Immature Granulocytes: 0 %
Lymphocytes Relative: 36 %
Lymphs Abs: 3.2 K/uL (ref 0.7–4.0)
MCH: 28.7 pg (ref 26.0–34.0)
MCHC: 34.8 g/dL (ref 30.0–36.0)
MCV: 82.7 fL (ref 80.0–100.0)
Monocytes Absolute: 0.5 K/uL (ref 0.1–1.0)
Monocytes Relative: 6 %
Neutro Abs: 5 K/uL (ref 1.7–7.7)
Neutrophils Relative %: 56 %
Platelets: 254 K/uL (ref 150–400)
RBC: 5.67 MIL/uL — ABNORMAL HIGH (ref 3.87–5.11)
RDW: 12.4 % (ref 11.5–15.5)
WBC: 8.9 K/uL (ref 4.0–10.5)
nRBC: 0 % (ref 0.0–0.2)

## 2023-10-21 LAB — URINALYSIS, ROUTINE W REFLEX MICROSCOPIC
Bilirubin Urine: NEGATIVE
Glucose, UA: NEGATIVE mg/dL
Ketones, ur: NEGATIVE mg/dL
Leukocytes,Ua: NEGATIVE
Nitrite: NEGATIVE
Protein, ur: NEGATIVE mg/dL
Specific Gravity, Urine: 1.01 (ref 1.005–1.030)
pH: 6.5 (ref 5.0–8.0)

## 2023-10-21 LAB — COMPREHENSIVE METABOLIC PANEL WITH GFR
ALT: 13 U/L (ref 0–44)
AST: 23 U/L (ref 15–41)
Albumin: 5 g/dL (ref 3.5–5.0)
Alkaline Phosphatase: 57 U/L (ref 38–126)
Anion gap: 14 (ref 5–15)
BUN: 11 mg/dL (ref 6–20)
CO2: 24 mmol/L (ref 22–32)
Calcium: 9.8 mg/dL (ref 8.9–10.3)
Chloride: 102 mmol/L (ref 98–111)
Creatinine, Ser: 0.76 mg/dL (ref 0.44–1.00)
GFR, Estimated: >60 mL/min (ref >60)
Glucose, Bld: 103 mg/dL — ABNORMAL HIGH (ref 70–99)
Potassium: 3.3 mmol/L — ABNORMAL LOW (ref 3.5–5.1)
Sodium: 140 mmol/L (ref 135–145)
Total Bilirubin: 0.4 mg/dL (ref 0.0–1.2)
Total Protein: 7.8 g/dL (ref 6.5–8.1)

## 2023-10-21 MED ORDER — MORPHINE SULFATE (PF) 4 MG/ML IV SOLN
4.0000 mg | Freq: Once | INTRAVENOUS | Status: AC
  Administered 2023-10-21: 4 mg via INTRAVENOUS
  Filled 2023-10-21: qty 1

## 2023-10-21 MED ORDER — KETOROLAC TROMETHAMINE 30 MG/ML IJ SOLN
30.0000 mg | Freq: Once | INTRAMUSCULAR | Status: AC
  Administered 2023-10-21: 30 mg via INTRAVENOUS
  Filled 2023-10-21: qty 1

## 2023-10-21 MED ORDER — SODIUM CHLORIDE 0.9 % IV BOLUS
1000.0000 mL | Freq: Once | INTRAVENOUS | Status: AC
  Administered 2023-10-21: 1000 mL via INTRAVENOUS

## 2023-10-21 MED ORDER — IOHEXOL 300 MG/ML  SOLN
100.0000 mL | Freq: Once | INTRAMUSCULAR | Status: AC | PRN
  Administered 2023-10-21: 85 mL via INTRAVENOUS

## 2023-10-21 MED ORDER — HYDROMORPHONE HCL 1 MG/ML IJ SOLN
1.0000 mg | Freq: Once | INTRAMUSCULAR | Status: AC
  Administered 2023-10-21: 1 mg via INTRAVENOUS
  Filled 2023-10-21: qty 1

## 2023-10-21 MED ORDER — ONDANSETRON HCL 4 MG/2ML IJ SOLN
4.0000 mg | Freq: Once | INTRAMUSCULAR | Status: AC
  Administered 2023-10-21: 4 mg via INTRAVENOUS
  Filled 2023-10-21: qty 2

## 2023-10-21 NOTE — ED Provider Notes (Signed)
 [place] [facility]Y DEPARTMENT AT [facility] Provider Note   CSN: [id number] Arrival date & time: 10/21/23  9671     Patient presents with: Abdominal Pain   Cyntia Staley is a 48 y.o. female.   Patient is a 48 year old female with past medical history of prior cholecystectomy, chronic back pain.  Patient presenting today with complaints of right sided abdominal pain.  This started earlier this morning and is worsening.  She denies any fevers or chills.  She has felt nauseated, but has not vomited.  No urinary complaints.  Pain worse with movement and palpation with no alleviating factors.       Prior to Admission medications   Medication Sig Start Date End Date Taking? Authorizing Provider  acetaminophen  (TYLENOL ) 500 MG tablet Take 1,000 mg by mouth every 6 (six) hours as needed for mild pain or headache.    [provider]  Biotin 5000 MCG TABS Take 5,000 mcg by mouth daily.    [provider]  Cholecalciferol (VITAMIN D-3) 5000 UNITS TABS Take 5,000 Units by mouth daily.     [provider]  docusate sodium  (COLACE) 100 MG capsule Take 1 capsule (100 mg total) by mouth 2 (two) times daily as needed for mild constipation. Patient not taking: Reported on 11/10/2017 09/17/17   Bissell, Jaclyn M, PA-C  doxycycline (VIBRA-TABS) 100 MG tablet Take 100 mg by mouth 2 (two) times daily.    [provider]  ibuprofen  (ADVIL ,MOTRIN ) 200 MG tablet Take 600 mg by mouth every 8 (eight) hours as needed (pain).     [provider]  oxyCODONE  (OXY IR/ROXICODONE ) 5 MG immediate release tablet Take 1-2 tablets (5-10 mg total) by mouth every 6 (six) hours as needed for severe pain. 11/19/17   Duwayne Purchase, MD  ranitidine (ZANTAC) 150 MG tablet Take 150 mg by mouth every 8 (eight) hours as needed for heartburn. Takes with ibuprofen     [provider]    Allergies: Cortisone, Gabapentin, Amoxicillin, Ceclor [cefaclor], Claritin  [loratadine], Codeine, Topamax [topiramate], Zithromax [azithromycin], and Elemental sulfur    Review of Systems  All other systems reviewed and are negative.   Updated Vital Signs BP (!) 172/90   Pulse 73   Temp 97.9 F (36.6 C) (Oral)   Resp 20   Ht 5' 1 (1.549 m)   Wt 70.3 kg   LMP 10/13/2023 (Approximate)   SpO2 97%   BMI 29.29 kg/m   Physical Exam Vitals and nursing note reviewed.  Constitutional:      General: She is not in acute distress.    Appearance: She is well-developed. She is not diaphoretic.  HENT:     Head: Normocephalic and atraumatic.  Cardiovascular:     Rate and Rhythm: Normal rate and regular rhythm.     Heart sounds: No murmur heard.    No friction rub. No gallop.  Pulmonary:     Effort: Pulmonary effort is normal. No respiratory distress.     Breath sounds: Normal breath sounds. No wheezing.  Abdominal:     General: Bowel sounds are normal. There is no distension.     Palpations: Abdomen is soft.     Tenderness: There is abdominal tenderness in the right lower quadrant. There is no right CVA tenderness, left CVA tenderness, guarding or rebound.  Musculoskeletal:        General: Normal range of motion.     Cervical back: Normal range of motion and neck supple.  Skin:  General: Skin is warm and dry.  Neurological:     General: No focal deficit present.     Mental Status: She is alert and oriented to person, place, and time.     (all labs ordered are listed, but only abnormal results are displayed) Labs Reviewed  CBC WITH DIFFERENTIAL/PLATELET - Abnormal; Notable for the following components:      Result Value   RBC 5.67 (*)    Hemoglobin 16.3 (*)    HCT 46.9 (*)    All other components within normal limits  COMPREHENSIVE METABOLIC PANEL WITH GFR - Abnormal; Notable for the following components:   Potassium 3.3 (*)    Glucose, Bld 103 (*)    All other components within normal limits  URINALYSIS, ROUTINE W REFLEX MICROSCOPIC -  Abnormal; Notable for the following components:   Color, Urine COLORLESS (*)    Hgb urine dipstick SMALL (*)    Bacteria, UA RARE (*)    All other components within normal limits    EKG: None  Radiology: CT ABDOMEN PELVIS W CONTRAST Result Date: 10/21/2023 CLINICAL DATA:  48 year old female with acute abdominal pain 0100 hours. EXAM: CT ABDOMEN AND PELVIS WITH CONTRAST TECHNIQUE: Multidetector CT imaging of the abdomen and pelvis was performed using the standard protocol following bolus administration of intravenous contrast. RADIATION DOSE REDUCTION: This exam was performed according to the departmental dose-optimization program which includes automated exposure control, adjustment of the mA and/or kV according to patient size and/or use of iterative reconstruction technique. CONTRAST:  85mL OMNIPAQUE  IOHEXOL  300 MG/ML  SOLN COMPARISON:  Noncontrast CT Abdomen and Pelvis 12/26/2016. FINDINGS: Lower chest: Negative. Hepatobiliary: Chronic cholecystectomy.  Negative liver. Pancreas: Negative. Spleen: Negative. Adrenals/Urinary Tract: Normal adrenal glands. Mildly delayed nephrogram on both initial and delayed post-contrast imaging series, although there is bilateral renal contrast excretion to the ureters. There is an oval 5 mm calculus at the right ureteropelvic junction (coronal image 44) with asymmetric urothelial thickening and enhancement in the right kidney and proximal right ureter. Punctate additional right nephrolithiasis. Decompressed right ureter distal to the stone. Incidental pelvic phleboliths. Negative bladder. Stomach/Bowel: Negative large bowel. Normal gas containing appendix on coronal image 59. Decompressed terminal ileum. Intermittent fluid containing small bowel loops, but none abnormally dilated. Small volume gas and fluid in the stomach. Decompressed duodenum. No pneumoperitoneum, free fluid, or mesenteric inflammation identified. Vascular/Lymphatic: Mild Aortoiliac calcified  atherosclerosis. Major arterial structures in the abdomen and pelvis are patent. Normal caliber abdominal aorta. Patent portal venous system. No lymphadenopathy. Reproductive: Heterogeneous and lobulated uterine enlargement is new since 2018, suggesting multiple intramural and occasional subserosal fibroids (dorsal fundus 3.3 cm subserosal fibroid on sagittal image 64). Ovaries are within normal limits. Other: No pelvis free fluid. Musculoskeletal: Chronic mid lumbar disc and endplate degeneration with chronic reversal of lordosis there. No acute osseous abnormality identified. IMPRESSION: 1. Evidence of both Obstructive Uropathy on the right - due to a 5 mm UPJ calculus - and right side urothelial thickening/inflammation. Punctate additional right nephrolithiasis. Left kidney, ureter, and bladder appear negative. 2. Fibroid uterus, new/progressed since 2018. Electronically Signed   By: VEAR Hurst M.D.   On: 10/21/2023 05:43     Procedures   Medications Ordered in the ED  HYDROmorphone  (DILAUDID ) injection 1 mg (has no administration in time range)  ondansetron  (ZOFRAN ) injection 4 mg (4 mg Intravenous Given 10/21/23 0405)  sodium chloride  0.9 % bolus 1,000 mL (0 mLs Intravenous Stopped 10/21/23 0527)  morphine  (PF) 4 MG/ML injection 4 mg (  4 mg Intravenous Given 10/21/23 0408)  ketorolac  (TORADOL ) 30 MG/ML injection 30 mg (30 mg Intravenous Given 10/21/23 0406)  iohexol  (OMNIPAQUE ) 300 MG/ML solution 100 mL (85 mLs Intravenous Contrast Given 10/21/23 0437)                                    Medical Decision Making Amount and/or Complexity of Data Reviewed Labs: ordered. Radiology: ordered.  Risk Prescription drug management.   Patient is a 48 year old female presenting with complaints of right lower quadrant pain as described in the HPI.  She arrives with stable vital signs and is afebrile.  Laboratory studies obtained including CBC, CMP, and urinalysis.  She has no leukocytosis and laboratory  studies are otherwise unremarkable.  CT scan of the abdomen and pelvis obtained showing obstructive uropathy on the right due to a 5 mm stone in the UPJ.  Patient has received Toradol , morphine , and Dilaudid  for pain.  She seems to be feeling somewhat better.  At this point, I feel as though patient can safely be discharged.  I will have her take the hydrocodone that has been previously prescribed for her and have her follow-up with urology in the next 1 to 2 days.     Final diagnoses:  None    ED Discharge Orders     None          Geroldine Berg, MD 10/21/23 [phone]

## 2023-10-21 NOTE — ED Triage Notes (Signed)
  Patient comes in with RLQ pain that woke her up from sleep around 0100 this morning.  Patient states she has been having intermittent abdominal pains for the last month but nothing this severe.  Endorses nausea with no vomiting.  Hx kidney stones.  Pain 9/10, sharp/stabbing.

## 2023-10-21 NOTE — Discharge Instructions (Signed)
 Continue taking hydrocodone as previously prescribed as needed for pain.  Follow-up with urology in the next 1 to 2 days.  The contact information for [facility] has been provided in this discharge summary for you to call this morning and make these arrangements.

## 2023-10-22 ENCOUNTER — Other Ambulatory Visit: Payer: Self-pay | Admitting: Urology

## 2023-10-29 ENCOUNTER — Encounter (HOSPITAL_COMMUNITY): Payer: Self-pay | Admitting: Urology

## 2023-10-29 NOTE — H&P (Signed)
 1 - Rt UPJ Stone - 6mm solitary Rt UPJ stone with mild hydro on ER CT 10/2023. UA withotu infectious paramters. Cr 0.7. KUB 9/9 still with some Rt sided renal contrast and stoen hard to see, but was visible cleary at L3-L4 level on CT scout.   PMH sig for chole, orthos survery, GERD, obesity, endometrial ablation, borderlinde DM2/zepbound. NO ischemic CV disease / blood thinners. HEr PCP is with [facility], gets GYN care with Alm Cook MD.   Today  Bari  is seen as new patient work in for KUB and Rt ureteraal stone DX on ER CT yesterday. Pain controlled at present. Has tamsulsin, strainer, hydrocodone.     ALLERGIES: Amoxicillin - Nausea, Hives, Vomiting Ceclor - Other Reaction, hypertension Codeine - Vomiting (Moderate to Severe), Nausea (Moderate to Severe) Cortisone - Anaphylaxis (Severe) Elemental Sulfur - Skin Rash (Moderate to Severe), Itching (Moderate to Severe) Gabapentin - Swelling (Moderate to Severe) Loratadine - Other Reaction, increase heart rate Topamax - Other Reaction (Moderate to Severe), blistering Zithromax - Hives (Moderate to Severe)   MEDICATIONS: No Medications    GU PSH: No GU PSH     NON-GU PSH: No Non-GU PSH          GU PMH: No GU PMH     NON-GU PMH: No Non-GU PMH     FAMILY HISTORY: No Family History     SOCIAL HISTORY: No Social History     REVIEW OF SYSTEMS:     GU Review Female:  Patient denies frequent urination, hard to postpone urination, burning /pain with urination, get up at night to urinate, leakage of urine, stream starts and stops, trouble starting your stream, have to strain to urinate, and being pregnant.    Gastrointestinal (Upper):  Patient denies nausea, vomiting, and indigestion/ heartburn.    Gastrointestinal (Lower):  Patient denies diarrhea and constipation.    Constitutional:  Patient denies fever, night sweats, weight loss, and fatigue.    Skin:  Patient denies skin rash/ lesion and itching.    Eyes:  Patient denies blurred  vision and double vision.    Ears/ Nose/ Throat:  Patient denies sore throat and sinus problems.    Hematologic/Lymphatic:  Patient denies swollen glands and easy bruising.    Cardiovascular:  Patient denies leg swelling and chest pains.    Respiratory:  Patient denies cough and shortness of breath.    Endocrine:  Patient denies excessive thirst.    Musculoskeletal:  Patient denies back pain and joint pain.    Neurological:  Patient denies headaches and dizziness.    Psychologic:  Patient denies depression and anxiety.    VITAL SIGNS: None     GU PHYSICAL EXAMINATION:      Bladder: Normal to palpation, no tenderness, no mass, normal size.     MULTI-SYSTEM PHYSICAL EXAMINATION:      Constitutional: Well-nourished. No physical deformities. Normally developed. Good grooming.     Neck: Neck symmetrical, not swollen. Normal tracheal position.     Respiratory: No labored breathing, no use of accessory muscles.      Cardiovascular: Normal temperature, normal extremity pulses, no swelling, no varicosities.     Lymphatic: No enlargement of neck, axillae, groin.     Skin: No paleness, no jaundice, no cyanosis. No lesion, no ulcer, no rash.     Neurologic / Psychiatric: Oriented to time, oriented to place, oriented to person. No depression, no anxiety, no agitation.     Gastrointestinal: No mass, no tenderness,  no rigidity, mild truncl obesity. Moderate Rt CVAT     Eyes: Normal conjunctivae. Normal eyelids.     Ears, Nose, Mouth, and Throat: Left ear no scars, no lesions, no masses. Right ear no scars, no lesions, no masses. Nose no scars, no lesions, no masses. Normal hearing. Normal lips.     Musculoskeletal: Normal gait and station of head and neck.            Complexity of Data:   Lab Test Review:  BMP  Records Review:  Previous Hospital Records, Previous Patient Records  Urine Test Review:  Urinalysis  X-Ray Review: KUB: Reviewed Films. Discussed With Patient.  C.T. Abdomen/Pelvis:  Reviewed Films. Reviewed Report. Discussed With Patient.     PROCEDURES:    KUB - [id number]  A single view of the abdomen is obtained.      Patient confirmed No Neulasta OnPro Device. Contrast pooling stil in Rt kidney some scant extrection past UPJ c/w known Rt UPJ stone. Stone itself obscured by contrast but was visible L3 area on CT scout yesterday.      Urinalysis w/Scope  Dipstick Dipstick Cont'd Micro  Color: Yellow Bilirubin: Neg mg/dL WBC/hpf: NS (Not Seen)  Appearance: Slightly Cloudy Ketones: Neg mg/dL RBC/hpf: 0 - 2/hpf  Specific Gravity: 1.015 Blood: 1+ ery/uL Bacteria: Few (10-25/hpf)  pH: 6.5 Protein: 1+ mg/dL Cystals: NS (Not Seen)  Glucose: Neg mg/dL Urobilinogen: 0.2 mg/dL Casts: NS (Not Seen)   Nitrites: Neg Trichomonas: Not Present   Leukocyte Esterase: Neg leu/uL Mucous: Not Present    Epithelial Cells: 0 - 5/hpf    Yeast: NS (Not Seen)    Sperm: Not Present    ASSESSMENT:     ICD-10 Details  1 GU:  Ureteral calculus - N20.1 Right, Undiagnosed New Problem   PLAN:   Orders  X-Rays: KUB  Schedule    Document  Letter(s):  Created for Patient: Clinical Summary   Notes:  Discussed options of MET (<50% chanced of passage), SWL, (90% chance signel stage treatmetn if visible on KUB), or URS (95% chance single stage stone free) in order of increasing efficacy but also invasiveness. SHe opt for Rt SWL next avail. I agree. tamsulsoin, hydorcodone, strainer for interval. She will hold Zepbound this Thursday.

## 2023-10-29 NOTE — Progress Notes (Signed)
 Spoke w/ via phone for pre-op interview---Patient Lab needs dos--KUB-         Lab results------ COVID test --Not indicated--patient states asymptomatic no test needed Arrive at ----0645--- NPO after MN  Pre-Surgery Ensure or G2:  Med rec completed Medications to take morning of surgery ----- Diabetic medication -----  GLP1 agonist last dose: 10/16/23 GLP1 instructions: on hold until after procedure  Patient instructed no nail polish to be worn day of surgery Patient instructed to bring photo id and insurance card day of surgery Patient aware to have Driver (ride ) / caregiver    for 24 hours after surgery -spouseGLENWOOD Bienenstock [phone]  Patient Special Instructions -----On Thursday, take laxative of choice, hydrate well, eat a light meal that evening, bring blue folder from [facility], no  money, valuables, credit cards, or metal from waist down, no flip flops, sandals Pre-Op special Instructions ---per Norita Dustman / Litho  Patient verbalized understanding of instructions that were given at this phone interview. Patient denies chest pain, sob, fever, cough at the interview.

## 2023-10-31 ENCOUNTER — Ambulatory Visit (HOSPITAL_COMMUNITY)
Admission: RE | Admit: 2023-10-31 | Discharge: 2023-10-31 | Disposition: A | Discharge status: Home / Self Care | Attending: Urology | Admitting: Urology

## 2023-10-31 ENCOUNTER — Ambulatory Visit (HOSPITAL_COMMUNITY)

## 2023-10-31 ENCOUNTER — Encounter (HOSPITAL_COMMUNITY)
Admission: RE | Disposition: A | Discharge status: Home / Self Care | Payer: Self-pay | Source: Home / Self Care | Attending: Urology

## 2023-10-31 ENCOUNTER — Other Ambulatory Visit: Payer: Self-pay

## 2023-10-31 ENCOUNTER — Encounter (HOSPITAL_COMMUNITY): Payer: Self-pay | Admitting: Urology

## 2023-10-31 DIAGNOSIS — E669 Obesity, unspecified: Secondary | ICD-10-CM | POA: Diagnosis not present

## 2023-10-31 DIAGNOSIS — R7303 Prediabetes: Secondary | ICD-10-CM | POA: Diagnosis not present

## 2023-10-31 DIAGNOSIS — Z79899 Other long term (current) drug therapy: Secondary | ICD-10-CM | POA: Diagnosis not present

## 2023-10-31 DIAGNOSIS — N200 Calculus of kidney: Secondary | ICD-10-CM | POA: Insufficient documentation

## 2023-10-31 HISTORY — PX: EXTRACORPOREAL SHOCK WAVE LITHOTRIPSY: SHX1557

## 2023-10-31 LAB — POCT PREGNANCY, URINE: Preg Test, Ur: NEGATIVE

## 2023-10-31 SURGERY — LITHOTRIPSY, ESWL
Anesthesia: LOCAL | Laterality: Right

## 2023-10-31 MED ORDER — DIPHENHYDRAMINE HCL 25 MG PO CAPS
25.0000 mg | ORAL_CAPSULE | ORAL | Status: AC
  Administered 2023-10-31: 25 mg via ORAL
  Filled 2023-10-31: qty 1

## 2023-10-31 MED ORDER — ONDANSETRON HCL 4 MG/2ML IJ SOLN
4.0000 mg | Freq: Once | INTRAMUSCULAR | Status: AC
  Administered 2023-10-31: 4 mg via INTRAVENOUS
  Filled 2023-10-31: qty 2

## 2023-10-31 MED ORDER — DIAZEPAM 5 MG PO TABS: 10.0000 mg | ORAL_TABLET | ORAL | Status: DC

## 2023-10-31 MED ORDER — OXYCODONE HCL 5 MG PO TABS
5.0000 mg | ORAL_TABLET | Freq: Once | ORAL | Status: AC
  Administered 2023-10-31: 5 mg via ORAL
  Filled 2023-10-31: qty 1

## 2023-10-31 MED ORDER — FENTANYL CITRATE PF 50 MCG/ML IJ SOSY
100.0000 ug | PREFILLED_SYRINGE | INTRAMUSCULAR | Status: DC | PRN
  Administered 2023-10-31: 50 ug via INTRAVENOUS
  Filled 2023-10-31: qty 2

## 2023-10-31 MED ORDER — SODIUM CHLORIDE 0.9 % IV SOLN: INTRAVENOUS | Status: DC

## 2023-10-31 MED ORDER — CIPROFLOXACIN HCL 500 MG PO TABS
500.0000 mg | ORAL_TABLET | ORAL | Status: AC
Start: 2023-10-31 — End: 2023-10-31
  Administered 2023-10-31: 500 mg via ORAL
  Filled 2023-10-31: qty 1

## 2023-10-31 MED ORDER — DIAZEPAM 5 MG PO TABS
5.0000 mg | ORAL_TABLET | Freq: Once | ORAL | Status: AC
  Administered 2023-10-31: 5 mg via ORAL
  Filled 2023-10-31: qty 1

## 2023-10-31 NOTE — Discharge Instructions (Addendum)
 I have reviewed discharge instructions in detail with the patient. They will follow-up with me or their physician as scheduled. My nurse will also be calling the patients as per protocol.

## 2023-10-31 NOTE — Interval H&P Note (Signed)
 History and Physical Interval Note:  10/31/2023 7:22 AM  Christine Davenport  has presented today for surgery, with the diagnosis of RIGHT URETERAL STONE.  The various methods of treatment have been discussed with the patient and family. After consideration of risks, benefits and other options for treatment, the patient has consented to  Procedure(s): LITHOTRIPSY, ESWL (Right) as a surgical intervention.  The patient's history has been reviewed, patient examined, no change in status, stable for surgery.  I have reviewed the patient's chart and labs.  Questions were answered to the patient's satisfaction.     Hancel Ion A Tyreonna Czaplicki

## 2023-11-03 ENCOUNTER — Encounter (HOSPITAL_COMMUNITY): Payer: Self-pay | Admitting: Urology
